# Patient Record
Sex: Male | Born: 1940 | Race: White | Hispanic: No | Marital: Married | Smoking: Former smoker
Health system: Southern US, Community
[De-identification: ages and names within clinical notes are randomized; demographics above are authoritative.]

## PROBLEM LIST (undated history)

## (undated) DIAGNOSIS — Z801 Family history of malignant neoplasm of trachea, bronchus and lung: Secondary | ICD-10-CM

## (undated) DIAGNOSIS — C801 Malignant (primary) neoplasm, unspecified: Secondary | ICD-10-CM

## (undated) DIAGNOSIS — M1990 Unspecified osteoarthritis, unspecified site: Secondary | ICD-10-CM

## (undated) DIAGNOSIS — R319 Hematuria, unspecified: Secondary | ICD-10-CM

## (undated) DIAGNOSIS — R351 Nocturia: Secondary | ICD-10-CM

## (undated) DIAGNOSIS — Z8041 Family history of malignant neoplasm of ovary: Secondary | ICD-10-CM

## (undated) HISTORY — DX: Family history of malignant neoplasm of trachea, bronchus and lung: Z80.1

## (undated) HISTORY — DX: Family history of malignant neoplasm of ovary: Z80.41

---

## 1975-03-08 HISTORY — PX: BACK SURGERY: SHX140

## 1998-03-07 HISTORY — PX: CHOLECYSTECTOMY: SHX55

## 2006-06-28 ENCOUNTER — Ambulatory Visit (HOSPITAL_COMMUNITY): Admission: RE | Admit: 2006-06-28 | Discharge: 2006-06-28 | Payer: Self-pay | Admitting: Orthopaedic Surgery

## 2012-03-07 HISTORY — PX: MENISCUS REPAIR: SHX5179

## 2013-04-26 HISTORY — PX: PROSTATE BIOPSY: SHX241

## 2013-05-31 ENCOUNTER — Other Ambulatory Visit: Payer: Self-pay | Admitting: Urology

## 2013-05-31 NOTE — Progress Notes (Signed)
Surgery on 06/24/13.  Need orders in EPIC.  Thank You.

## 2013-06-14 ENCOUNTER — Other Ambulatory Visit (HOSPITAL_COMMUNITY): Payer: Self-pay | Admitting: Urology

## 2013-06-14 NOTE — Patient Instructions (Addendum)
[place]  06/14/2013   Your procedure is scheduled on: Monday April 20th, 2015  Report to [facility] at 830 AM.  Call this number if you have problems the morning of surgery [phone]   Remember: Maish Vaya  Do not eat food :After Midnight Saturday NIGHT, CLEAR LIQUIDS ALL DAY Sunday 04-25-13, NO CLEAR LIQUIDS AFTER MIDNIGHT Sunday NIGHT      Take these medicines the morning of surgery with A SIP OF WATER:  Eye  drops                               You may not have any metal on your body including hair pins and piercings  Do not wear jewelry, make-up, lotions, powders, or deodorant.   Men may shave face and neck.  Do not bring valuables to the hospital. [place].  Contacts, dentures or bridgework may not be worn into surgery.  Leave suitcase in the car. After surgery it may be brought to your room.  For patients admitted to the hospital, checkout time is 11:00 AM the day of discharge.   Patients discharged the day of surgery will not be allowed to drive home.  Name and phone number of your driver:  Special Instructions: N/A  [facility] - Preparing for Surgery Before surgery, you can play an important role.  Because skin is not sterile, your skin needs to be as free of germs as possible.  You can reduce the number of germs on your skin by washing with CHG (chlorahexidine gluconate) soap before surgery.  CHG is an antiseptic cleaner which kills germs and bonds with the skin to continue killing germs even after washing. Please DO NOT use if you have an allergy to CHG or antibacterial soaps.  If your skin becomes reddened/irritated stop using the CHG and inform your nurse when you arrive at Short Stay. Do not shave (including legs and underarms) for at least 48 hours prior to the first CHG shower.  You may shave your face. Please follow these instructions carefully:  1.  Shower with CHG  Soap the night before surgery and the  morning of Surgery.  2.  If you choose to wash your hair, wash your hair first as usual with your  normal  shampoo.  3.  After you shampoo, rinse your hair and body thoroughly to remove the  shampoo.                           4.  Use CHG as you would any other liquid soap.  You can apply chg directly  to the skin and wash                       Gently with a scrungie or clean washcloth.  5.  Apply the CHG Soap to your body ONLY FROM THE NECK DOWN.   Do not use on open                           Wound or open sores. Avoid contact with eyes, ears mouth and genitals (private parts).                        Genitals (private parts)  with your normal soap.             6.  Wash thoroughly, paying special attention to the area where your surgery  will be performed.  7.  Thoroughly rinse your body with warm water from the neck down.  8.  DO NOT shower/wash with your normal soap after using and rinsing off  the CHG Soap.                9.  Pat yourself dry with a clean towel.            10.  Wear clean pajamas.            11.  Place clean sheets on your bed the night of your first shower and do not  sleep with pets. Day of Surgery : Do not apply any lotions/deodorants the morning of surgery.  Please wear clean clothes to the hospital/surgery center.  FAILURE TO FOLLOW THESE INSTRUCTIONS MAY RESULT IN THE CANCELLATION OF YOUR SURGERY PATIENT SIGNATURE_________________________________  NURSE SIGNATURE__________________________________    CLEAR LIQUID DIET   Foods Allowed                                                                     Foods Excluded  Coffee and tea, regular and decaf                             liquids that you cannot  Plain Jell-O in any flavor                                             see through such as: Fruit ices (not with fruit pulp)                                     milk, soups, orange juice  Iced Popsicles                                     All solid food Carbonated beverages, regular and diet                                    Cranberry, grape and apple juices Sports drinks like Gatorade Lightly seasoned clear broth or consume(fat free) Sugar, honey syrup  Sample Menu Breakfast                                Lunch                                     Supper Cranberry juice                    Beef broth  Chicken broth Jell-O                                     Grape juice                           Apple juice Coffee or tea                        Jell-O                                      Popsicle                                                Coffee or tea                        Coffee or tea Incentive Spirometer  An incentive spirometer is a tool that can help keep your lungs clear and active. This tool measures how well you are filling your lungs with each breath. Taking long deep breaths may help reverse or decrease the chance of developing breathing (pulmonary) problems (especially infection) following:  A long period of time when you are unable to move or be active. BEFORE THE PROCEDURE   If the spirometer includes an indicator to show your best effort, your nurse or respiratory therapist will set it to a desired goal.  If possible, sit up straight or lean slightly forward. Try not to slouch.  Hold the incentive spirometer in an upright position. INSTRUCTIONS FOR USE  1. Sit on the edge of your bed if possible, or sit up as far as you can in bed or on a chair. 2. Hold the incentive spirometer in an upright position. 3. Breathe out normally. 4. Place the mouthpiece in your mouth and seal your lips tightly around it. 5. Breathe in slowly and as deeply as possible, raising the piston or the ball toward the top of the column. 6. Hold your breath for 3-5 seconds or for as long as possible. Allow the piston or ball to fall to the bottom of the column. 7. Remove the mouthpiece from your  mouth and breathe out normally. 8. Rest for a few seconds and repeat Steps 1 through 7 at least 10 times every 1-2 hours when you are awake. Take your time and take a few normal breaths between deep breaths. 9. The spirometer may include an indicator to show your best effort. Use the indicator as a goal to work toward during each repetition. 10. After each set of 10 deep breaths, practice coughing to be sure your lungs are clear. If you have an incision (the cut made at the time of surgery), support your incision when coughing by placing a pillow or rolled up towels firmly against it. Once you are able to get out of bed, walk around indoors and cough well. You may stop using the incentive spirometer when instructed by your caregiver.  RISKS AND COMPLICATIONS  Take your time so you do not get dizzy or light-headed.  If you are in pain, you may need to take or  ask for pain medication before doing incentive spirometry. It is harder to take a deep breath if you are having pain. AFTER USE  Rest and breathe slowly and easily.  It can be helpful to keep track of a log of your progress. Your caregiver can provide you with a simple table to help with this. If you are using the spirometer at home, follow these instructions: [place] IF:   You are having difficultly using the spirometer.  You have trouble using the spirometer as often as instructed.  Your pain medication is not giving enough relief while using the spirometer.  You develop fever of 100.5 F (38.1 C) or higher. SEEK IMMEDIATE MEDICAL CARE IF:   You cough up bloody sputum that had not been present before.  You develop fever of 102 F (38.9 C) or greater.  You develop worsening pain at or near the incision site. MAKE SURE YOU:   Understand these instructions.  Will watch your condition.  Will get help right away if you are not doing well or get worse. Document Released: 07/04/2006 Document Revised: 05/16/2011  Document Reviewed: 09/04/2006 [facility] Patient Information 2014 [place], [place].   WHAT IS A BLOOD TRANSFUSION? Blood Transfusion Information  A transfusion is the replacement of blood or some of its parts. Blood is made up of multiple cells which provide different functions.  Red blood cells carry oxygen and are used for blood loss replacement.  White blood cells fight against infection.  Platelets control bleeding.  Plasma helps clot blood.  Other blood products are available for specialized needs, such as hemophilia or other clotting disorders. BEFORE THE TRANSFUSION  Who gives blood for transfusions?   Healthy volunteers who are fully evaluated to make sure their blood is safe. This is blood bank blood. Transfusion therapy is the safest it has ever been in the practice of medicine. Before blood is taken from a donor, a complete history is taken to make sure that person has no history of diseases nor engages in risky social behavior (examples are intravenous drug use or sexual activity with multiple partners). The donor's travel history is screened to minimize risk of transmitting infections, such as malaria. The donated blood is tested for signs of infectious diseases, such as HIV and hepatitis. The blood is then tested to be sure it is compatible with you in order to minimize the chance of a transfusion reaction. If you or a relative donates blood, this is often done in anticipation of surgery and is not appropriate for emergency situations. It takes many days to process the donated blood. RISKS AND COMPLICATIONS Although transfusion therapy is very safe and saves many lives, the main dangers of transfusion include:   Getting an infectious disease.  Developing a transfusion reaction. This is an allergic reaction to something in the blood you were given. Every precaution is taken to prevent this. The decision to have a blood transfusion has been considered carefully by your caregiver  before blood is given. Blood is not given unless the benefits outweigh the risks. AFTER THE TRANSFUSION  Right after receiving a blood transfusion, you will usually feel much better and more energetic. This is especially true if your red blood cells have gotten low (anemic). The transfusion raises the level of the red blood cells which carry oxygen, and this usually causes an energy increase.  The nurse administering the transfusion will monitor you carefully for complications. HOME CARE INSTRUCTIONS  No special instructions are needed after a transfusion.  You may find your energy is better. Speak with your caregiver about any limitations on activity for underlying diseases you may have. SEEK MEDICAL CARE IF:   Your condition is not improving after your transfusion.  You develop redness or irritation at the intravenous (IV) site. SEEK IMMEDIATE MEDICAL CARE IF:  Any of the following symptoms occur over the next 12 hours:  Shaking chills.  You have a temperature by mouth above 102 F (38.9 C), not controlled by medicine.  Chest, back, or muscle pain.  People around you feel you are not acting correctly or are confused.  Shortness of breath or difficulty breathing.  Dizziness and fainting.  You get a rash or develop hives.  You have a decrease in urine output.  Your urine turns a dark color or changes to pink, red, or brown. Any of the following symptoms occur over the next 10 days:  You have a temperature by mouth above 102 F (38.9 C), not controlled by medicine.  Shortness of breath.  Weakness after normal activity.  The white part of the eye turns yellow (jaundice).  You have a decrease in the amount of urine or are urinating less often.  Your urine turns a dark color or changes to pink, red, or brown. Document Released: 02/19/2000 Document Revised: 05/16/2011 Document Reviewed: 10/08/2007 [facility] Patient Information 2014 [place].

## 2013-06-17 ENCOUNTER — Encounter (HOSPITAL_COMMUNITY)
Admission: RE | Admit: 2013-06-17 | Discharge: 2013-06-17 | Disposition: A | Discharge status: Home / Self Care | Payer: PRIVATE HEALTH INSURANCE | Source: Ambulatory Visit | Attending: Urology | Admitting: Urology

## 2013-06-17 ENCOUNTER — Encounter (INDEPENDENT_AMBULATORY_CARE_PROVIDER_SITE_OTHER): Payer: Self-pay

## 2013-06-17 ENCOUNTER — Encounter (HOSPITAL_COMMUNITY): Payer: Self-pay

## 2013-06-17 ENCOUNTER — Ambulatory Visit (HOSPITAL_COMMUNITY)
Admission: RE | Admit: 2013-06-17 | Discharge: 2013-06-17 | Disposition: A | Discharge status: Home / Self Care | Payer: PRIVATE HEALTH INSURANCE | Source: Ambulatory Visit | Attending: Urology | Admitting: Urology

## 2013-06-17 ENCOUNTER — Encounter (HOSPITAL_COMMUNITY): Payer: Self-pay | Admitting: Pharmacy Technician

## 2013-06-17 DIAGNOSIS — Z01812 Encounter for preprocedural laboratory examination: Secondary | ICD-10-CM | POA: Insufficient documentation

## 2013-06-17 HISTORY — DX: Malignant (primary) neoplasm, unspecified: C80.1

## 2013-06-17 HISTORY — DX: Unspecified osteoarthritis, unspecified site: M19.90

## 2013-06-17 HISTORY — DX: Hematuria, unspecified: R31.9

## 2013-06-17 HISTORY — DX: Nocturia: R35.1

## 2013-06-17 LAB — CBC
HCT: 48 % (ref 39.0–52.0)
Hemoglobin: 16.4 g/dL (ref 13.0–17.0)
MCH: 30.4 pg (ref 26.0–34.0)
MCHC: 34.2 g/dL (ref 30.0–36.0)
MCV: 88.9 fL (ref 78.0–100.0)
Platelets: 217 10*3/uL (ref 150–400)
RBC: 5.4 MIL/uL (ref 4.22–5.81)
RDW: 13.3 % (ref 11.5–15.5)
WBC: 8.9 10*3/uL (ref 4.0–10.5)

## 2013-06-17 LAB — BASIC METABOLIC PANEL
BUN: 17 mg/dL (ref 6–23)
CO2: 26 mEq/L (ref 19–32)
Calcium: 9.8 mg/dL (ref 8.4–10.5)
Chloride: 102 mEq/L (ref 96–112)
Creatinine, Ser: 1.16 mg/dL (ref 0.50–1.35)
GFR calc Af Amer: 72 mL/min — ABNORMAL LOW (ref >90)
GFR calc non Af Amer: 62 mL/min — ABNORMAL LOW (ref >90)
Glucose, Bld: 77 mg/dL (ref 70–99)
Potassium: 4.7 mEq/L (ref 3.7–5.3)
Sodium: 140 mEq/L (ref 137–147)

## 2013-06-21 NOTE — H&P (Signed)
  Chief Complaint Prostate Cancer    History of Present Illness Douglas Baxter is a 73 year old gentleman who was noted to have a rising PSA of 7.5 which prompted a prostate needle biopsy on 04/26/13 by Dr. Lerry Liner which demonstrated Gleason 4+3=7 adenocarcinoma of the prostate. He had undergone a CT scan of the abdomen and pelvis in October 2014 when he had a 3 mm left proximal ureteral stone. No pelvic lymphadenopathy was noted at that time. A bone scan was performed on 05/13/13 which did not demonstrate any evidence of osseous metastatic disease. He has been counseled about his treatment options by Dr. Lerry Liner and Dr. Danny Lawless. Dr. Danny Lawless has offered him EBRT with 8 months of ADT as an option.  He has no major comorbidities.  TNM stage: cT1c N0 M0  PSA: 7.5 Gleason score: 4+3=7 Biopsy (04/26/13 - read by Dr. Willeen Niece, [facility], Acc# [phone]): 20% of right sided biopsy tissue positive Prostate volume:  Urinary function: He has minimal lower urinary tract symptoms with mild nocturia likely related increase fluid intake. IPSS is 5. 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 SHIM score is 19. He does not consider erectile function to be a priority with regard to his quality of life.   Past Medical History Problems  1. History of arthritis (V13.4) 2. History of benign prostatic hypertrophy (V13.89)  Surgical History Problems  1. History of Appendectomy 2. History of Back Surgery 3. History of Cholecystectomy 4. History of Sinus Surgery  Current Meds 1. Aspirin 81 MG Oral Tablet;  Therapy: (Recorded:24Mar2015) to Recorded 2. Cyclobenzaprine HCl - 10 MG Oral Tablet;  Therapy: [id number] to Recorded 3. Meloxicam 15 MG Oral Tablet;  Therapy: [id number] to Recorded 4. Multi-Vitamin TABS;  Therapy: (Recorded:24Mar2015) to Recorded  Allergies Medication  1. Sulfa Drugs  Family History Problems  1. Family history of blood clots (V18.3) : Mother 2. Family  history of cardiac disorder (V17.49) : Father 3. Family history of lung cancer (V16.1) : Brother 4. Family history of ovarian cancer (V16.41) : Sister  Social History Problems    Alcohol use (V49.89)   drinks hard liquor   Former smoker [place])   smoked about a pack a day for 20 years; quit smoking in 1995   Married  Review of Systems Genitourinary, constitutional, skin, eye, otolaryngeal, hematologic/lymphatic, cardiovascular, pulmonary, endocrine, musculoskeletal, gastrointestinal, neurological and psychiatric system(s) were reviewed and pertinent findings if present are noted.    Vitals  Height: 6 ft 1 in Weight: 180 lb  BMI Calculated: 23.75 BSA Calculated: 2.06   Physical Exam Constitutional: Well nourished and well developed . No acute distress.  ENT:. The ears and nose are normal in appearance.  Neck: The appearance of the neck is normal and no neck mass is present.  Pulmonary: No respiratory distress, normal respiratory rhythm and effort and clear bilateral breath sounds.  Cardiovascular: Heart rate and rhythm are normal . No peripheral edema.  Abdomen: The abdomen is soft and nontender. No masses are palpated. No CVA tenderness. No hernias are palpable. No hepatosplenomegaly noted.    Assessment Assessed  1. Prostate cancer (185)   Discussion/Summary 1. Prostate cancer:  He would like to go forward with surgical therapy and will undergo a bilateral nerve sparing robotic-assisted laparoscopic radical prostatectomy and bilateral pelvic lymphadenectomy.

## 2013-06-24 ENCOUNTER — Encounter (HOSPITAL_COMMUNITY)
Admission: RE | Disposition: A | Discharge status: Home / Self Care | Payer: Self-pay | Source: Ambulatory Visit | Attending: Urology

## 2013-06-24 ENCOUNTER — Encounter (HOSPITAL_COMMUNITY): Payer: Self-pay | Admitting: *Deleted

## 2013-06-24 ENCOUNTER — Inpatient Hospital Stay (HOSPITAL_COMMUNITY): Payer: PRIVATE HEALTH INSURANCE | Admitting: Anesthesiology

## 2013-06-24 ENCOUNTER — Inpatient Hospital Stay (HOSPITAL_COMMUNITY)
Admission: RE | Admit: 2013-06-24 | Discharge: 2013-06-25 | DRG: 708 | Disposition: A | Discharge status: Home / Self Care | Payer: PRIVATE HEALTH INSURANCE | Source: Ambulatory Visit | Attending: Urology | Admitting: Urology

## 2013-06-24 ENCOUNTER — Encounter (HOSPITAL_COMMUNITY): Payer: PRIVATE HEALTH INSURANCE | Admitting: Anesthesiology

## 2013-06-24 DIAGNOSIS — Z7982 Long term (current) use of aspirin: Secondary | ICD-10-CM

## 2013-06-24 DIAGNOSIS — Z8249 Family history of ischemic heart disease and other diseases of the circulatory system: Secondary | ICD-10-CM

## 2013-06-24 DIAGNOSIS — N3289 Other specified disorders of bladder: Secondary | ICD-10-CM | POA: Diagnosis not present

## 2013-06-24 DIAGNOSIS — C61 Malignant neoplasm of prostate: Principal | ICD-10-CM | POA: Diagnosis present

## 2013-06-24 DIAGNOSIS — Z801 Family history of malignant neoplasm of trachea, bronchus and lung: Secondary | ICD-10-CM

## 2013-06-24 DIAGNOSIS — Z79899 Other long term (current) drug therapy: Secondary | ICD-10-CM

## 2013-06-24 DIAGNOSIS — Z8041 Family history of malignant neoplasm of ovary: Secondary | ICD-10-CM

## 2013-06-24 DIAGNOSIS — Z87891 Personal history of nicotine dependence: Secondary | ICD-10-CM

## 2013-06-24 DIAGNOSIS — Z882 Allergy status to sulfonamides status: Secondary | ICD-10-CM

## 2013-06-24 DIAGNOSIS — Z832 Family history of diseases of the blood and blood-forming organs and certain disorders involving the immune mechanism: Secondary | ICD-10-CM

## 2013-06-24 HISTORY — PX: ROBOT ASSISTED LAPAROSCOPIC RADICAL PROSTATECTOMY: SHX5141

## 2013-06-24 HISTORY — PX: LYMPHADENECTOMY: SHX5960

## 2013-06-24 LAB — TYPE AND SCREEN
ABO/RH(D): B POS
ANTIBODY SCREEN: NEGATIVE

## 2013-06-24 LAB — HEMOGLOBIN AND HEMATOCRIT, BLOOD
HCT: 45.9 % (ref 39.0–52.0)
HEMOGLOBIN: 15.7 g/dL (ref 13.0–17.0)

## 2013-06-24 LAB — ABO/RH: ABO/RH(D): B POS

## 2013-06-24 SURGERY — ROBOTIC ASSISTED LAPAROSCOPIC RADICAL PROSTATECTOMY LEVEL 2
Anesthesia: General

## 2013-06-24 MED ORDER — HYDROMORPHONE HCL PF 1 MG/ML IJ SOLN
INTRAMUSCULAR | Status: AC
  Filled 2013-06-24: qty 1

## 2013-06-24 MED ORDER — CEFAZOLIN SODIUM-DEXTROSE 2-3 GM-% IV SOLR
INTRAVENOUS | Status: AC
  Filled 2013-06-24: qty 50

## 2013-06-24 MED ORDER — LACTATED RINGERS IV SOLN
INTRAVENOUS | Status: DC | PRN
  Administered 2013-06-24: 11:00:00

## 2013-06-24 MED ORDER — ROCURONIUM BROMIDE 100 MG/10ML IV SOLN
INTRAVENOUS | Status: AC
  Filled 2013-06-24: qty 1

## 2013-06-24 MED ORDER — SODIUM CHLORIDE 0.9 % IR SOLN
Status: DC | PRN
  Administered 2013-06-24: 14:00:00
  Administered 2013-06-24: 1000 mL via INTRAVESICAL

## 2013-06-24 MED ORDER — PROPOFOL 10 MG/ML IV BOLUS
INTRAVENOUS | Status: DC | PRN
  Administered 2013-06-24: 150 mg via INTRAVENOUS

## 2013-06-24 MED ORDER — PROPOFOL 10 MG/ML IV BOLUS
INTRAVENOUS | Status: AC
Start: 2013-06-24 — End: 2013-06-24
  Filled 2013-06-24: qty 20

## 2013-06-24 MED ORDER — SUCCINYLCHOLINE CHLORIDE 20 MG/ML IJ SOLN
INTRAMUSCULAR | Status: DC | PRN
  Administered 2013-06-24: 100 mg via INTRAVENOUS

## 2013-06-24 MED ORDER — HEPARIN SODIUM (PORCINE) 1000 UNIT/ML IJ SOLN
INTRAMUSCULAR | Status: AC
  Filled 2013-06-24: qty 1

## 2013-06-24 MED ORDER — HYDROCODONE-ACETAMINOPHEN 5-325 MG PO TABS: 1.0000 | ORAL_TABLET | Freq: Four times a day (QID) | ORAL | Status: DC | PRN

## 2013-06-24 MED ORDER — SODIUM CHLORIDE 0.9 % IJ SOLN
INTRAMUSCULAR | Status: AC
  Filled 2013-06-24: qty 10

## 2013-06-24 MED ORDER — BELLADONNA ALKALOIDS-OPIUM 16.2-60 MG RE SUPP
RECTAL | Status: AC
  Filled 2013-06-24: qty 1

## 2013-06-24 MED ORDER — HYDROMORPHONE HCL PF 1 MG/ML IJ SOLN: 0.5000 mg | INTRAMUSCULAR | Status: DC | PRN

## 2013-06-24 MED ORDER — HYDROMORPHONE HCL PF 1 MG/ML IJ SOLN
0.2500 mg | INTRAMUSCULAR | Status: DC | PRN
  Administered 2013-06-24 (×3): 0.5 mg via INTRAVENOUS
  Administered 2013-06-24: 0.25 mg via INTRAVENOUS
  Administered 2013-06-24: 0.5 mg via INTRAVENOUS

## 2013-06-24 MED ORDER — HYDRALAZINE HCL 20 MG/ML IJ SOLN
INTRAMUSCULAR | Status: AC
  Filled 2013-06-24: qty 1

## 2013-06-24 MED ORDER — KETOROLAC TROMETHAMINE 15 MG/ML IJ SOLN
15.0000 mg | Freq: Four times a day (QID) | INTRAMUSCULAR | Status: DC
  Administered 2013-06-24 – 2013-06-25 (×4): 15 mg via INTRAVENOUS
  Filled 2013-06-24 (×5): qty 1

## 2013-06-24 MED ORDER — DEXAMETHASONE SODIUM PHOSPHATE 10 MG/ML IJ SOLN
INTRAMUSCULAR | Status: DC | PRN
  Administered 2013-06-24: 10 mg via INTRAVENOUS

## 2013-06-24 MED ORDER — MORPHINE SULFATE 2 MG/ML IJ SOLN
2.0000 mg | INTRAMUSCULAR | Status: DC | PRN
  Administered 2013-06-24: 2 mg via INTRAVENOUS
  Filled 2013-06-24: qty 1

## 2013-06-24 MED ORDER — BELLADONNA ALKALOIDS-OPIUM 16.2-60 MG RE SUPP: 1.0000 | Freq: Four times a day (QID) | RECTAL | Status: DC | PRN

## 2013-06-24 MED ORDER — GLYCOPYRROLATE 0.2 MG/ML IJ SOLN
INTRAMUSCULAR | Status: DC | PRN
Start: 2013-06-24 — End: 2013-06-24
  Administered 2013-06-24: 0.6 mg via INTRAVENOUS

## 2013-06-24 MED ORDER — BELLADONNA ALKALOIDS-OPIUM 16.2-60 MG RE SUPP
1.0000 | Freq: Every day | RECTAL | Status: DC
  Administered 2013-06-24: 1 via RECTAL

## 2013-06-24 MED ORDER — ROCURONIUM BROMIDE 100 MG/10ML IV SOLN
INTRAVENOUS | Status: DC | PRN
  Administered 2013-06-24: 10 mg via INTRAVENOUS
  Administered 2013-06-24: 20 mg via INTRAVENOUS
  Administered 2013-06-24: 30 mg via INTRAVENOUS

## 2013-06-24 MED ORDER — NEOSTIGMINE METHYLSULFATE 1 MG/ML IJ SOLN
INTRAMUSCULAR | Status: DC | PRN
  Administered 2013-06-24: 4 mg via INTRAVENOUS

## 2013-06-24 MED ORDER — KETOROLAC TROMETHAMINE 15 MG/ML IJ SOLN
INTRAMUSCULAR | Status: AC
  Administered 2013-06-24: 15 mg
  Filled 2013-06-24: qty 1

## 2013-06-24 MED ORDER — NEOSTIGMINE METHYLSULFATE 1 MG/ML IJ SOLN
INTRAMUSCULAR | Status: AC
  Filled 2013-06-24: qty 10

## 2013-06-24 MED ORDER — BUPIVACAINE-EPINEPHRINE 0.25% -1:200000 IJ SOLN
INTRAMUSCULAR | Status: DC | PRN
  Administered 2013-06-24: 26 mL

## 2013-06-24 MED ORDER — ONDANSETRON HCL 4 MG/2ML IJ SOLN
INTRAMUSCULAR | Status: AC
  Filled 2013-06-24: qty 2

## 2013-06-24 MED ORDER — FENTANYL CITRATE 0.05 MG/ML IJ SOLN
INTRAMUSCULAR | Status: AC
  Filled 2013-06-24: qty 5

## 2013-06-24 MED ORDER — CEFAZOLIN SODIUM-DEXTROSE 2-3 GM-% IV SOLR
2.0000 g | INTRAVENOUS | Status: AC
  Administered 2013-06-24: 2 g via INTRAVENOUS

## 2013-06-24 MED ORDER — BUPIVACAINE-EPINEPHRINE (PF) 0.25% -1:200000 IJ SOLN
INTRAMUSCULAR | Status: AC
  Filled 2013-06-24: qty 30

## 2013-06-24 MED ORDER — HYDRALAZINE HCL 20 MG/ML IJ SOLN
INTRAMUSCULAR | Status: DC | PRN
  Administered 2013-06-24 (×4): 5 mg via INTRAVENOUS

## 2013-06-24 MED ORDER — KCL IN DEXTROSE-NACL 20-5-0.45 MEQ/L-%-% IV SOLN
INTRAVENOUS | Status: DC
  Administered 2013-06-24 – 2013-06-25 (×2): via INTRAVENOUS
  Filled 2013-06-24 (×4): qty 1000

## 2013-06-24 MED ORDER — ACETAMINOPHEN 325 MG PO TABS: 650.0000 mg | ORAL_TABLET | ORAL | Status: DC | PRN

## 2013-06-24 MED ORDER — SODIUM CHLORIDE 0.9 % IV BOLUS (SEPSIS)
1000.0000 mL | Freq: Once | INTRAVENOUS | Status: AC
  Administered 2013-06-24: 1000 mL via INTRAVENOUS

## 2013-06-24 MED ORDER — DOCUSATE SODIUM 100 MG PO CAPS
100.0000 mg | ORAL_CAPSULE | Freq: Two times a day (BID) | ORAL | Status: DC
  Administered 2013-06-24 – 2013-06-25 (×2): 100 mg via ORAL
  Filled 2013-06-24 (×3): qty 1

## 2013-06-24 MED ORDER — POLYVINYL ALCOHOL 1.4 % OP SOLN
1.0000 [drp] | Freq: Three times a day (TID) | OPHTHALMIC | Status: DC | PRN
  Filled 2013-06-24: qty 15

## 2013-06-24 MED ORDER — GLYCOPYRROLATE 0.2 MG/ML IJ SOLN
INTRAMUSCULAR | Status: AC
  Filled 2013-06-24: qty 3

## 2013-06-24 MED ORDER — PROMETHAZINE HCL 25 MG/ML IJ SOLN: 6.2500 mg | INTRAMUSCULAR | Status: DC | PRN

## 2013-06-24 MED ORDER — BACITRACIN-NEOMYCIN-POLYMYXIN 400-5-5000 EX OINT
TOPICAL_OINTMENT | CUTANEOUS | Status: AC
  Filled 2013-06-24: qty 1

## 2013-06-24 MED ORDER — FENTANYL CITRATE 0.05 MG/ML IJ SOLN
INTRAMUSCULAR | Status: DC | PRN
  Administered 2013-06-24: 100 ug via INTRAVENOUS
  Administered 2013-06-24: 25 ug via INTRAVENOUS
  Administered 2013-06-24 (×5): 50 ug via INTRAVENOUS
  Administered 2013-06-24: 25 ug via INTRAVENOUS

## 2013-06-24 MED ORDER — CEFAZOLIN SODIUM 1-5 GM-% IV SOLN
1.0000 g | Freq: Three times a day (TID) | INTRAVENOUS | Status: AC
  Administered 2013-06-24 – 2013-06-25 (×2): 1 g via INTRAVENOUS
  Filled 2013-06-24 (×2): qty 50

## 2013-06-24 MED ORDER — DIPHENHYDRAMINE HCL 50 MG/ML IJ SOLN: 12.5000 mg | Freq: Four times a day (QID) | INTRAMUSCULAR | Status: DC | PRN

## 2013-06-24 MED ORDER — ONDANSETRON HCL 4 MG/2ML IJ SOLN
INTRAMUSCULAR | Status: DC | PRN
  Administered 2013-06-24: 4 mg via INTRAVENOUS

## 2013-06-24 MED ORDER — LACTATED RINGERS IV SOLN
INTRAVENOUS | Status: DC
  Administered 2013-06-24: 1000 mL via INTRAVENOUS
  Administered 2013-06-24: 12:00:00 via INTRAVENOUS

## 2013-06-24 MED ORDER — DEXAMETHASONE SODIUM PHOSPHATE 10 MG/ML IJ SOLN
INTRAMUSCULAR | Status: AC
  Filled 2013-06-24: qty 1

## 2013-06-24 MED ORDER — DIPHENHYDRAMINE HCL 12.5 MG/5ML PO ELIX: 12.5000 mg | ORAL_SOLUTION | Freq: Four times a day (QID) | ORAL | Status: DC | PRN

## 2013-06-24 MED ORDER — CIPROFLOXACIN HCL 500 MG PO TABS: 500.0000 mg | ORAL_TABLET | Freq: Two times a day (BID) | ORAL | Status: DC

## 2013-06-24 MED ORDER — HYPROMELLOSE (GONIOSCOPIC) 2.5 % OP SOLN: 1.0000 [drp] | Freq: Three times a day (TID) | OPHTHALMIC | Status: DC | PRN

## 2013-06-24 SURGICAL SUPPLY — 45 items
ADH SKN CLS APL DERMABOND .7 (GAUZE/BANDAGES/DRESSINGS) ×2
CABLE HIGH FREQUENCY MONO STRZ (ELECTRODE) ×4 IMPLANT
CATH FOLEY 2WAY SLVR  5CC 18FR (CATHETERS) ×2
CATH FOLEY 2WAY SLVR 18FR 30CC (CATHETERS) ×4 IMPLANT
CATH FOLEY 2WAY SLVR 5CC 18FR (CATHETERS) IMPLANT
CATH ROBINSON RED A/P 16FR (CATHETERS) ×4 IMPLANT
CATH ROBINSON RED A/P 8FR (CATHETERS) ×4 IMPLANT
CATH TIEMANN FOLEY 18FR 5CC (CATHETERS) ×4 IMPLANT
CHLORAPREP W/TINT 26ML (MISCELLANEOUS) ×4 IMPLANT
CLIP LIGATING HEM O LOK PURPLE (MISCELLANEOUS) ×8 IMPLANT
CLOTH BEACON ORANGE TIMEOUT ST (SAFETY) ×4 IMPLANT
COVER SURGICAL LIGHT HANDLE (MISCELLANEOUS) ×4 IMPLANT
COVER TIP SHEARS 8 DVNC (MISCELLANEOUS) ×2 IMPLANT
COVER TIP SHEARS 8MM DA VINCI (MISCELLANEOUS) ×2
CUTTER ECHEON FLEX ENDO 45 340 (ENDOMECHANICALS) ×4 IMPLANT
DECANTER SPIKE VIAL GLASS SM (MISCELLANEOUS) ×2 IMPLANT
DERMABOND ADVANCED (GAUZE/BANDAGES/DRESSINGS) ×2
DERMABOND ADVANCED .7 DNX12 (GAUZE/BANDAGES/DRESSINGS) IMPLANT
DRAPE SURG IRRIG POUCH 19X23 (DRAPES) ×4 IMPLANT
DRSG TEGADERM 4X4.75 (GAUZE/BANDAGES/DRESSINGS) ×4 IMPLANT
DRSG TEGADERM 6X8 (GAUZE/BANDAGES/DRESSINGS) ×8 IMPLANT
ELECT REM PT RETURN 9FT ADLT (ELECTROSURGICAL) ×4
ELECTRODE REM PT RTRN 9FT ADLT (ELECTROSURGICAL) ×2 IMPLANT
GLOVE BIO SURGEON STRL SZ 6.5 (GLOVE) ×3 IMPLANT
GLOVE BIO SURGEONS STRL SZ 6.5 (GLOVE) ×1
GLOVE BIOGEL M STRL SZ7.5 (GLOVE) ×26 IMPLANT
GOWN STRL REUS W/TWL LRG LVL3 (GOWN DISPOSABLE) ×20 IMPLANT
HOLDER FOLEY CATH W/STRAP (MISCELLANEOUS) ×4 IMPLANT
IV LACTATED RINGERS 1000ML (IV SOLUTION) ×4 IMPLANT
KIT ACCESSORY DA VINCI DISP (KITS) ×2
KIT ACCESSORY DVNC DISP (KITS) ×2 IMPLANT
MANIFOLD NEPTUNE II (INSTRUMENTS) ×4 IMPLANT
NDL SAFETY ECLIPSE 18X1.5 (NEEDLE) ×2 IMPLANT
NEEDLE HYPO 18GX1.5 SHARP (NEEDLE) ×4
PACK ROBOT UROLOGY CUSTOM (CUSTOM PROCEDURE TRAY) ×4 IMPLANT
RELOAD GREEN ECHELON 45 (STAPLE) ×4 IMPLANT
SET TUBE IRRIG SUCTION NO TIP (IRRIGATION / IRRIGATOR) ×4 IMPLANT
SOLUTION ELECTROLUBE (MISCELLANEOUS) ×4 IMPLANT
SUT ETHILON 3 0 PS 1 (SUTURE) ×4 IMPLANT
SUT MNCRL AB 4-0 PS2 18 (SUTURE) ×8 IMPLANT
SUT VICRYL 0 UR6 27IN ABS (SUTURE) ×8 IMPLANT
SYR 27GX1/2 1ML LL SAFETY (SYRINGE) ×4 IMPLANT
TOWEL OR 17X26 10 PK STRL BLUE (TOWEL DISPOSABLE) ×4 IMPLANT
TOWEL OR NON WOVEN STRL DISP B (DISPOSABLE) ×4 IMPLANT
WATER STERILE IRR 1500ML POUR (IV SOLUTION) ×8 IMPLANT

## 2013-06-24 NOTE — Transfer of Care (Signed)
Immediate Anesthesia Transfer of Care Note  Patient: Douglas Baxter  Procedure(s) Performed: Procedure(s): ROBOTIC ASSISTED LAPAROSCOPIC RADICAL PROSTATECTOMY LEVEL 2 (N/A) LYMPHADENECTOMY (Bilateral)  Patient Location: PACU  Anesthesia Type:General  Level of Consciousness: awake, alert , oriented and patient cooperative  Airway & Oxygen Therapy: Patient Spontanous Breathing and Patient connected to face mask oxygen  Post-op Assessment: Report given to PACU RN and Post -op Vital signs reviewed and stable  Post vital signs: Reviewed and stable  Complications: No apparent anesthesia complications

## 2013-06-24 NOTE — Anesthesia Postprocedure Evaluation (Signed)
  Anesthesia Post-op Note  Patient: Douglas Baxter  Procedure(s) Performed: Procedure(s) (LRB): ROBOTIC ASSISTED LAPAROSCOPIC RADICAL PROSTATECTOMY LEVEL 2 (N/A) LYMPHADENECTOMY (Bilateral)  Patient Location: PACU  Anesthesia Type: General  Level of Consciousness: awake and alert   Airway and Oxygen Therapy: Patient Spontanous Breathing  Post-op Pain: mild  Post-op Assessment: Post-op Vital signs reviewed, Patient's Cardiovascular Status Stable, Respiratory Function Stable, Patent Airway and No signs of Nausea or vomiting  Last Vitals:  Filed Vitals:   06/24/13 1245  BP: 118/47  Pulse: 70  Temp:   Resp: 12    Post-op Vital Signs: stable   Complications: No apparent anesthesia complications

## 2013-06-24 NOTE — Addendum Note (Signed)
Addendum created 06/24/13 1342 by Salley Scarlet, MD   Modules edited: Orders

## 2013-06-24 NOTE — Anesthesia Preprocedure Evaluation (Signed)
Anesthesia Evaluation  Patient identified by MRN, date of birth, ID band Patient awake    Reviewed: Allergy & Precautions, H&P , NPO status , Patient's Chart, lab work & pertinent test results  Airway Mallampati: II TM Distance: >3 FB Neck ROM: Full    Dental no notable dental hx.    Pulmonary former smoker,  breath sounds clear to auscultation  Pulmonary exam normal       Cardiovascular negative cardio ROS  Rhythm:Regular Rate:Normal     Neuro/Psych negative neurological ROS  negative psych ROS   GI/Hepatic negative GI ROS, Neg liver ROS,   Endo/Other  negative endocrine ROS  Renal/GU negative Renal ROS  negative genitourinary   Musculoskeletal negative musculoskeletal ROS (+)   Abdominal   Peds negative pediatric ROS (+)  Hematology negative hematology ROS (+)   Anesthesia Other Findings   Reproductive/Obstetrics negative OB ROS                           Anesthesia Physical Anesthesia Plan  ASA: II  Anesthesia Plan: General   Post-op Pain Management:    Induction: Intravenous  Airway Management Planned: Oral ETT  Additional Equipment:   Intra-op Plan:   Post-operative Plan: Extubation in OR  Informed Consent: I have reviewed the patients History and Physical, chart, labs and discussed the procedure including the risks, benefits and alternatives for the proposed anesthesia with the patient or authorized representative who has indicated his/her understanding and acceptance.   Dental advisory given  Plan Discussed with: CRNA  Anesthesia Plan Comments:         Anesthesia Quick Evaluation

## 2013-06-24 NOTE — Discharge Instructions (Signed)
1. Activity:  You are encouraged to ambulate frequently (about every hour during waking hours) to help prevent blood clots from forming in your legs or lungs.  However, you should not engage in any heavy lifting (> 10-15 lbs), strenuous activity, or straining. 2. Diet: You should continue a clear liquid diet until passing gas from below.  Once this occurs, you may advance your diet to a soft diet that would be easy to digest (i.e soups, scrambled eggs, mashed potatoes, etc.) for 24 hours just as you would if getting over a bad stomach flu.  If tolerating this diet well for 24 hours, you may then begin eating regular food.  It will be normal to have some amount of bloating, nausea, and abdominal discomfort intermittently. 3. Prescriptions:  You will be provided a prescription for pain medication to take as needed.  If your pain is not severe enough to require the prescription pain medication, you may take Tylenol instead.  You should also take an over the counter stool softener (Colace 100 mg twice daily) to avoid straining with bowel movements as the pain medication may constipate you. Finally, you will also be provided a prescription for an antibiotic to begin the day prior to your return visit in the office for catheter removal. 4. Catheter care: You will be taught how to take care of the catheter by the nursing staff prior to discharge from the hospital.  You may use both a leg bag and the larger bedside bag but it is recommended to at least use the bigger bedside bag at nighttime as the leg bag is small and will fill up overnight and also does not drain as well when lying flat. You may periodically feel a strong urge to void with the catheter in place.  This is a bladder spasm and most often can occur when having a bowel movement or when you are moving around. It is typically self-limited and usually will stop after a few minutes.  You may use some Vaseline or Neosporin around the tip of the catheter to  reduce friction at the tip of the penis. 5. Incisions: You may remove your dressing bandages the 2nd day after surgery.  You most likely will have a few small staples in each of the incisions and once the bandages are removed, the incisions may stay open to air.  You may start showering (not soaking or bathing in water) 48 hours after surgery and the incisions simply need to be patted dry after the shower.  No additional care is needed. 6. What to call us about: You should call the office [phone]) if you develop fever > 101, persistent vomiting, or the catheter stops draining. Also, feel free to call with any other questions you may have and remember the handout that was provided to you as a reference preoperatively which answers many of the common questions that arise after surgery.  You may resume aspirin, advil, aleve, vitamins, and supplements 7 days after surgery.

## 2013-06-24 NOTE — Progress Notes (Signed)
Patient ID: SHAWNA KIENER, male   DOB: 1940-11-12, 73 y.o.   MRN: [id number]  Post-op note  Subjective: The patient is doing well.  Complains of bladder spasms  Objective: Vital signs in last 24 hours: Temp:  [97.5 F (36.4 C)-98.1 F (36.7 C)] 97.5 F (36.4 C) (04/20 1426) Pulse Rate:  [61-78] 65 (04/20 1426) Resp:  [10-20] 15 (04/20 1426) BP: (98-135)/(47-92) 123/55 mmHg (04/20 1426) SpO2:  [93 %-100 %] 93 % (04/20 1426) Weight:  [85.276 kg (188 lb)] 85.276 kg (188 lb) (04/20 1111)  Intake/Output from previous day:   Intake/Output this shift: Total I/O In: 3400 [I.V.:1400; IV Piggyback:2000] Out: 140 [Urine:50; Drains:40; Blood:50]  Physical Exam:  General: Alert and oriented. Abdomen: Soft, Nondistended. Incisions: Clean and dry.  Lab Results:  Recent Labs  06/24/13 1301  HGB 15.7  HCT 45.9    Assessment/Plan: POD#0   1) B & O suppository, NS bolus 500 cc 2) Ambulate, IS   Pryor Curia. MD   LOS: 0 days   [name] 06/24/2013, 2:30 PM

## 2013-06-24 NOTE — Op Note (Signed)
Preoperative diagnosis: Clinically localized adenocarcinoma of the prostate (clinical stage T1c N0 M0)  Postoperative diagnosis: Clinically localized adenocarcinoma of the prostate (clinical stage T1c N0 M0)  Procedure:  1. Robotic assisted laparoscopic radical prostatectomy (bilateral nerve sparing) 2. Bilateral robotic assisted laparoscopic pelvic lymphadenectomy  Surgeon: Pryor Curia. M.D.  Assistant: Leta Baptist, PA-C  Anesthesia: General  Complications: None  EBL: 50 mL  IVF:  1000 mL crystalloid  Specimens: 1. Prostate and seminal vesicles 2. Right pelvic lymph nodes 3. Left pelvic lymph nodes  Disposition of specimens: Pathology  Drains: 1. 20 Fr coude catheter 2. # 19 Blake pelvic drain  Indication: Douglas Baxter is a 73 y.o. year old patient with clinically localized prostate cancer.  After a thorough review of the management options for treatment of prostate cancer, he elected to proceed with surgical therapy and the above procedure(s).  We have discussed the potential benefits and risks of the procedure, side effects of the proposed treatment, the likelihood of the patient achieving the goals of the procedure, and any potential problems that might occur during the procedure or recuperation. Informed consent has been obtained.  Description of procedure:  The patient was taken to the operating room and a general anesthetic was administered. He was given preoperative antibiotics, placed in the dorsal lithotomy position, and prepped and draped in the usual sterile fashion. Next a preoperative timeout was performed. A urethral catheter was placed into the bladder and a site was selected near the umbilicus for placement of the camera port. This was placed using a standard open Hassan technique which allowed entry into the peritoneal cavity under direct vision and without difficulty. A 12 mm port was placed and a pneumoperitoneum established. The camera was then  used to inspect the abdomen and there was no evidence of any intra-abdominal injuries or other abnormalities. The remaining abdominal ports were then placed. 8 mm robotic ports were placed in the right lower quadrant, left lower quadrant, and far left lateral abdominal wall. A 5 mm port was placed in the right upper quadrant and a 12 mm port was placed in the right lateral abdominal wall for laparoscopic assistance. All ports were placed under direct vision without difficulty. The surgical cart was then docked.   Utilizing the cautery scissors, the bladder was reflected posteriorly allowing entry into the space of Retzius and identification of the endopelvic fascia and prostate. The periprostatic fat was then removed from the prostate allowing full exposure of the endopelvic fascia. The endopelvic fascia was then incised from the apex back to the base of the prostate bilaterally and the underlying levator muscle fibers were swept laterally off the prostate thereby isolating the dorsal venous complex. The dorsal vein was then stapled and divided with a 45 mm Flex Echelon stapler. Attention then turned to the bladder neck which was divided anteriorly thereby allowing entry into the bladder and exposure of the urethral catheter. The catheter balloon was deflated and the catheter was brought into the operative field and used to retract the prostate anteriorly. The posterior bladder neck was then examined and was divided allowing further dissection between the bladder and prostate posteriorly until the vasa deferentia and seminal vessels were identified. The vasa deferentia were isolated, divided, and lifted anteriorly. The seminal vesicles were dissected down to their tips with care to control the seminal vascular arterial blood supply. These structures were then lifted anteriorly and the space between Denonvillier's fascia and the anterior rectum was developed with a combination  of sharp and blunt dissection. This  isolated the vascular pedicles of the prostate.  The lateral prostatic fascia was then sharply incised allowing release of the neurovascular bundles bilaterally. The vascular pedicles of the prostate were then ligated with Weck clips between the prostate and neurovascular bundles and divided with sharp cold scissor dissection resulting in neurovascular bundle preservation. The neurovascular bundles were then separated off the apex of the prostate and urethra bilaterally.  The urethra was then sharply transected allowing the prostate specimen to be disarticulated. The pelvis was copiously irrigated and hemostasis was ensured. There was no evidence for rectal injury.  Attention then turned to the right pelvic sidewall. The fibrofatty tissue between the external iliac vein, confluence of the iliac vessels, hypogastric artery, and Cooper's ligament was dissected free from the pelvic sidewall with care to preserve the obturator nerve. Weck clips were used for lymphostasis and hemostasis. An identical procedure was performed on the contralateral side and the lymphatic packets were removed for permanent pathologic analysis.  Attention then turned to the urethral anastomosis. A 2-0 Vicryl slip knot was placed between Denonvillier's fascia, the posterior bladder neck, and the posterior urethra to reapproximate these structures. A double-armed 3-0 Monocryl suture was then used to perform a 360 running tension-free anastomosis between the bladder neck and urethra. A new urethral catheter was then placed into the bladder and irrigated. There were no blood clots within the bladder and the anastomosis appeared to be watertight. A #19 Blake drain was then brought through the left lateral 8 mm port site and positioned appropriately within the pelvis. It was secured to the skin with a nylon suture. The surgical cart was then undocked. The right lateral 12 mm port site was closed at the fascial level with a 0 Vicryl suture  placed laparoscopically. All remaining ports were then removed under direct vision. The prostate specimen was removed intact within the Endopouch retrieval bag via the periumbilical camera port site. This fascial opening was closed with two running 0 Vicryl sutures. 0.25% Marcaine was then injected into all port sites and all incisions were reapproximated at the skin level with 4-0 Monocryl subcuticular sutures and Dermabond. The patient appeared to tolerate the procedure well and without complications. The patient was able to be extubated and transferred to the recovery unit in satisfactory condition.   Pryor Curia MD

## 2013-06-25 ENCOUNTER — Encounter (HOSPITAL_COMMUNITY): Payer: Self-pay | Admitting: Urology

## 2013-06-25 LAB — HEMOGLOBIN AND HEMATOCRIT, BLOOD
HCT: 42.2 % (ref 39.0–52.0)
Hemoglobin: 14.4 g/dL (ref 13.0–17.0)

## 2013-06-25 MED ORDER — BISACODYL 10 MG RE SUPP
10.0000 mg | Freq: Once | RECTAL | Status: AC
  Administered 2013-06-25: 10 mg via RECTAL
  Filled 2013-06-25: qty 1

## 2013-06-25 MED ORDER — HYDROCODONE-ACETAMINOPHEN 5-325 MG PO TABS: 1.0000 | ORAL_TABLET | Freq: Four times a day (QID) | ORAL | Status: DC | PRN

## 2013-06-25 NOTE — Progress Notes (Signed)
Patient ID: Douglas Baxter, male   DOB: 1940/12/25, 73 y.o.   MRN: [id number]  1 Day Post-Op Subjective: The patient is doing well.  No nausea or vomiting. Pain is adequately controlled.  Objective: Vital signs in last 24 hours: Temp:  [97.5 F (36.4 C)-98.5 F (36.9 C)] 98.2 F (36.8 C) (04/21 0427) Pulse Rate:  [47-78] 49 (04/21 0427) Resp:  [10-20] 18 (04/21 0427) BP: (98-135)/(47-92) 129/56 mmHg (04/21 0427) SpO2:  [93 %-100 %] 97 % (04/21 0427) Weight:  [85.276 kg (188 lb)] 85.276 kg (188 lb) (04/20 1439)  Intake/Output from previous day: 04/20 0701 - 04/21 0700 In: 6462.5 [P.O.:720; I.V.:3642.5; IV Piggyback:2100] Out: 1170 [Urine:960; Drains:160; Blood:50] Intake/Output this shift:    Physical Exam:  General: Alert and oriented. CV: RRR Lungs: Clear bilaterally. GI: Soft, Nondistended. Incisions: Clean, dry, and intact Urine: Clear Extremities: Nontender, no erythema, no edema.  Lab Results:  Recent Labs  06/24/13 1301 06/25/13 0340  HGB 15.7 14.4  HCT 45.9 42.2      Assessment/Plan: POD# 1 s/p robotic prostatectomy.  1) SL IVF 2) Ambulate, Incentive spirometry 3) Transition to oral pain medication 4) Dulcolax suppository 5) D/C pelvic drain 6) Plan for likely discharge later today   Pryor Curia. MD   LOS: 1 day   [name] 06/25/2013, 7:05 AM

## 2013-06-25 NOTE — Discharge Summary (Signed)
  Date of admission: 06/24/2013  Date of discharge: 06/25/2013  Admission diagnosis: Prostate Cancer  Discharge diagnosis: Prostate Cancer  History and Physical: For full details, please see admission history and physical. Briefly, Douglas Baxter is a 73 y.o. gentleman with localized prostate cancer.  After discussing management/treatment options, he elected to proceed with surgical treatment.  Hospital Course: MENDELL BONTEMPO was taken to the operating room on 06/24/2013 and underwent a robotic assisted laparoscopic radical prostatectomy. He tolerated this procedure well and without complications. Postoperatively, he was able to be transferred to a regular hospital room following recovery from anesthesia.  He was able to begin ambulating the night of surgery. He remained hemodynamically stable overnight.  He had excellent urine output with appropriately minimal output from his pelvic drain and his pelvic drain was removed on POD #1.  He was transitioned to oral pain medication, tolerated a clear liquid diet, and had met all discharge criteria and was able to be discharged home later on POD#1.  Laboratory values:  Recent Labs  06/24/13 1301 06/25/13 0340  HGB 15.7 14.4  HCT 45.9 42.2    Disposition: Home  Discharge instruction: He was instructed to be ambulatory but to refrain from heavy lifting, strenuous activity, or driving. He was instructed on urethral catheter care.  Discharge medications:     Medication List    STOP taking these medications       aspirin EC 81 MG tablet     multivitamin with minerals Tabs tablet      TAKE these medications       ciprofloxacin 500 MG tablet  Commonly known as:  CIPRO  Take 1 tablet (500 mg total) by mouth 2 (two) times daily. Start day prior to office visit for foley removal     fexofenadine 180 MG tablet  Commonly known as:  ALLEGRA  Take 180 mg by mouth daily.     HYDROcodone-acetaminophen 5-325 MG per tablet  Commonly known as:   NORCO  Take 1-2 tablets by mouth every 6 (six) hours as needed.     hydroxypropyl methylcellulose 2.5 % ophthalmic solution  Commonly known as:  ISOPTO TEARS  Place 1 drop into both eyes 3 (three) times daily as needed for dry eyes.        Followup: He will followup in 1 week for catheter removal and to discuss his surgical pathology results.

## 2017-07-14 LAB — PULMONARY FUNCTION TEST
DLCO: 33.68 ml/mmHg sec
FEV1/FVC: 75 %
FEV1: 3015 L
FVC: 4.22 L
TLC: 7.24

## 2017-09-19 ENCOUNTER — Other Ambulatory Visit: Payer: Self-pay

## 2017-09-19 ENCOUNTER — Encounter (HOSPITAL_COMMUNITY): Payer: Self-pay | Admitting: Hematology

## 2017-09-19 ENCOUNTER — Inpatient Hospital Stay (HOSPITAL_COMMUNITY): Payer: Medicare HMO | Attending: Hematology | Admitting: Hematology

## 2017-09-19 VITALS — BP 139/69 | HR 62 | Temp 97.8°F | Resp 18 | Ht 73.0 in | Wt 209.0 lb

## 2017-09-19 DIAGNOSIS — C3492 Malignant neoplasm of unspecified part of left bronchus or lung: Secondary | ICD-10-CM

## 2017-09-19 DIAGNOSIS — Z8041 Family history of malignant neoplasm of ovary: Secondary | ICD-10-CM | POA: Insufficient documentation

## 2017-09-19 DIAGNOSIS — C7A8 Other malignant neuroendocrine tumors: Secondary | ICD-10-CM | POA: Insufficient documentation

## 2017-09-19 DIAGNOSIS — Z8546 Personal history of malignant neoplasm of prostate: Secondary | ICD-10-CM | POA: Insufficient documentation

## 2017-09-19 DIAGNOSIS — Z7901 Long term (current) use of anticoagulants: Secondary | ICD-10-CM | POA: Insufficient documentation

## 2017-09-19 DIAGNOSIS — Z801 Family history of malignant neoplasm of trachea, bronchus and lung: Secondary | ICD-10-CM | POA: Diagnosis not present

## 2017-09-19 DIAGNOSIS — Z950 Presence of cardiac pacemaker: Secondary | ICD-10-CM | POA: Diagnosis not present

## 2017-09-19 DIAGNOSIS — Z87891 Personal history of nicotine dependence: Secondary | ICD-10-CM | POA: Diagnosis not present

## 2017-09-19 DIAGNOSIS — I4891 Unspecified atrial fibrillation: Secondary | ICD-10-CM | POA: Insufficient documentation

## 2017-09-19 DIAGNOSIS — C3490 Malignant neoplasm of unspecified part of unspecified bronchus or lung: Secondary | ICD-10-CM

## 2017-09-19 DIAGNOSIS — C7B8 Other secondary neuroendocrine tumors: Secondary | ICD-10-CM | POA: Diagnosis not present

## 2017-09-19 DIAGNOSIS — Z7189 Other specified counseling: Secondary | ICD-10-CM | POA: Insufficient documentation

## 2017-09-19 DIAGNOSIS — Z803 Family history of malignant neoplasm of breast: Secondary | ICD-10-CM | POA: Insufficient documentation

## 2017-09-19 NOTE — Assessment & Plan Note (Signed)
1.  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 -PET CT scan on 07/27/2017 shows left lung mass with SUV of 14.6, probable metastatic left hilar lymph node, no other metastatic disease. - On 08/17/2017, underwent VATS left upper lobectomy, mediastinal lymphadenectomy. - Pathology shows 2.6 x 2.1 x 2 cm large cell neuroendocrine carcinoma, 2/9 peribronchial lymph nodes positive, station 5, 6, 11 lymph nodes negative, negative visceral pleural involvement - I have discussed extensively with the patient and his wife about how his new diagnosis.  Because of the worse prognosis compared with other forms of non-small cell lung cancer, I have recommended adjuvant chemotherapy that may offer the best chance to improve survival.  Usually these cancers are treated similar to small cell lung cancer.  Hence I have recommended combination of platinum and etoposide for 3 cycles.  He has mild degree of chronic renal insufficiency and a compromised hearing.  Hence I do not think he is a candidate for cisplatin.  We will recommend carboplatin AUC 5-6 on day 1 and etoposide 100 mg/m (day 1-3) every 21 days with Neulasta support for 4 cycles.  We talked about side effects in detail. -I have also recommended MRI of the brain to complete the staging work-up.  Since he has a pacemaker, we will change it to CT scan of the brain with and without contrast. -Patient is still feeling low energy.  He would like to delay his therapy until August 12 as he is going on a vacation.  2.  Family history: - Patient has personal history of prostate cancer which was resected in May 15, 2013.  Patient's brother died of lung cancer and was a smoker.  Sister died of ovarian cancer.  Hence I have recommended geneticist evaluation for BRCA and other mutation testing.  We will arrange it at next visit.  3.  Atrial fibrillation: -He has a pacemaker.   He is also continuing Eliquis twice daily.

## 2017-09-19 NOTE — Progress Notes (Signed)
START OFF PATHWAY REGIMEN - [Other Dx]   [id number]:Carboplatin AUC=5 D1 + Etoposide 120 mg/m2 D1-3 q28 Days:   A cycle is every 28 days:     Carboplatin      Etoposide   **Always confirm dose/schedule in your pharmacy ordering system**  Patient Characteristics: Intent of Therapy: Curative Intent, Discussed with Patient

## 2017-09-19 NOTE — Patient Instructions (Signed)
[facility] Discharge Instructions  Today you saw Dr. K.   Thank you for choosing [facility] at [facility] to provide your oncology and hematology care.  To afford each patient quality time with our provider, please arrive at least 15 minutes before your scheduled appointment time.   If you have a lab appointment with the Cancer Center please come in thru the  Main Entrance and check in at the main information desk  You need to re-schedule your appointment should you arrive 10 or more minutes late.  We strive to give you quality time with our providers, and arriving late affects you and other patients whose appointments are after yours.  Also, if you no show three or more times for appointments you may be dismissed from the clinic at the providers discretion.     Again, thank you for choosing [facility].  Our hope is that these requests will decrease the amount of time that you wait before being seen by our physicians.       _____________________________________________________________  Should you have questions after your visit to  Cancer Center, please contact our office at [phone] between the hours of 8:30 a.m. and 4:30 p.m.  Voicemails left after 4:30 p.m. will not be returned until the following business day.  For prescription refill requests, have your pharmacy contact our office.       Resources For Cancer Patients and their Caregivers ? American [employer]: Can assist with transportation, wigs, general needs, runs Look Good Feel Better.        1-888-227-6333 ? Cancer Care: Provides financial assistance, online support groups, medication/co-pay assistance.  1-800-813-HOPE (4673) ? [facility] Assists [place] cancer patients and their families through emotional , educational and financial support.  336-427-4357 ? [place] DSS Where to apply for food  stamps, Medicaid and utility assistance. 336-342-1394 ? [facility]: Transportation to medical appointments. 336-347-2287 ? Social Security Administration: May apply for disability if have a Stage IV cancer. 336-342-7796 1-800-772-1213 ? [place] [facility]ging, Disability and Transit Services: Assists with nutrition, care and transit needs. 336-349-2343  Cancer Center Support Programs:   > Cancer Support Group  2nd Tuesday of the month 1pm-2pm, Journey Room   > Creative Journey  3rd Tuesday of the month 1130am-1pm, Journey Room    

## 2017-09-19 NOTE — Progress Notes (Signed)
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  HISTORY OF PRESENTING ILLNESS:  Douglas Baxter 77 y.o. 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  Postprocedure chest x-ray showed left lung nodule.  This was followed up with a CT scan done at [facility].  The mass was confirmed and he was referred to Dr. Hulen Skains in [place].  A PET CT scan on 07/27/2017 shows left lung mass with SUV of 14.6 and probable metastatic left hilar lymph node.  No other metastatic disease was seen.  On 08/17/2017 he underwent VATS and left upper lobectomy, mediastinal lymphadenectomy.  This showed 2.6 x 2.1 x 2 cm lung mass consistent with large cell neuroendocrine carcinoma.  Margins were negative.  2 out of 9 peribronchial lymph nodes were positive.  Patient is recuperating from surgery.  Energy levels are slowly coming back to normal.  He smoked for 25 pack years and quit in 1996.  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  Otherwise he is very healthy and lives at home with his wife.  He is independent of ADLs and IADLs.  MEDICAL HISTORY:  Past Medical History:  Diagnosis Date  . Arthritis   . Cancer [facility])    prostate  . Hematuria   . Nocturia     SURGICAL HISTORY: Past Surgical History:  Procedure Laterality Date  . [place]   lower back  . CHOLECYSTECTOMY  2000  . LYMPHADENECTOMY Bilateral 06/24/2013   Procedure: LYMPHADENECTOMY;  Surgeon: [name], MD;  Location: [facility];  Service: Urology;  Laterality: Bilateral;  . MENISCUS REPAIR Right jan 2014  . PROSTATE  BIOPSY  Apr 26, 2013  . ROBOT ASSISTED LAPAROSCOPIC RADICAL PROSTATECTOMY N/A 06/24/2013   Procedure: ROBOTIC ASSISTED LAPAROSCOPIC RADICAL PROSTATECTOMY LEVEL 2;  Surgeon: [name], MD;  Location: [facility];  Service: Urology;  Laterality: N/A;    SOCIAL HISTORY: Social History   Socioeconomic History  . Marital status: Married    Spouse name: Not on file  . Number of children: Not on file  . Years of education: Not on file  . Highest education level: Not on file  Occupational History  . Not on file  Social Needs  . Financial resource strain: Not on file  . Food insecurity:    Worry: Not on file    Inability: Not on file  . Transportation needs:    Medical: Not on file    Non-medical: Not on file  Tobacco Use  . Smoking status: Former Smoker    Packs/day: 1.00    Types: Cigarettes    Last attempt to quit: 03/07/1978    Years since quitting: 39.5  . Smokeless tobacco: Never Used  Substance and Sexual Activity  . Alcohol use: Yes    Comment:  occasional  . Drug use: No  . Sexual activity: Not on file  Lifestyle  . Physical activity:    Days per week: Not on file    Minutes per session: Not on file  . Stress: Not on file  Relationships  . Social connections:  Talks on phone: Not on file    Gets together: Not on file    Attends religious service: Not on file    Active member of club or organization: Not on file    Attends meetings of clubs or organizations: Not on file    Relationship status: Not on file  . Intimate partner violence:    Fear of current or ex partner: Not on file    Emotionally abused: Not on file    Physically abused: Not on file    Forced sexual activity: Not on file  Other Topics Concern  . Not on file  Social History Narrative  . Not on file    FAMILY HISTORY: Family History  Problem Relation Age of Onset  . Deep vein thrombosis Mother   . Heart failure Father   . Ovarian cancer Sister   . Lung cancer Brother     ALLERGIES:  is  allergic to sulfa antibiotics.  MEDICATIONS:  Current Outpatient Medications  Medication Sig Dispense Refill  . apixaban (ELIQUIS) 5 MG TABS tablet Take 5 mg by mouth 2 (two) times daily.    . ciprofloxacin (CIPRO) 500 MG tablet Take 1 tablet (500 mg total) by mouth 2 (two) times daily. Start day prior to office visit for foley removal 6 tablet 0  . fexofenadine (ALLEGRA) 180 MG tablet Take 180 mg by mouth daily.    Marland Kitchen HYDROcodone-acetaminophen (NORCO) 5-325 MG per tablet Take 1-2 tablets by mouth every 6 (six) hours as needed. 30 tablet 0  . hydroxypropyl methylcellulose (ISOPTO TEARS) 2.5 % ophthalmic solution Place 1 drop into both eyes 3 (three) times daily as needed for dry eyes.     No current facility-administered medications for this visit.     REVIEW OF SYSTEMS:   Constitutional: Denies fevers, chills or abnormal night sweats.  Mild fatigue present. Eyes: Denies blurriness of vision, double vision or watery eyes Ears, nose, mouth, throat, and face: Denies mucositis or sore throat Respiratory: Denies cough, dyspnea or wheezes Cardiovascular: Denies palpitation, chest discomfort or lower extremity swelling Gastrointestinal:  Denies nausea, heartburn or change in bowel habits Skin: Denies abnormal skin rashes Lymphatics: Denies new lymphadenopathy or easy bruising Neurological:Denies numbness, tingling or new weaknesses Behavioral/Psych: Mood is stable, no new changes  All other systems were reviewed with the patient and are negative.  PHYSICAL EXAMINATION: ECOG PERFORMANCE STATUS: 1 - Symptomatic but completely ambulatory  Vitals:   09/19/17 1315  BP: 139/69  Pulse: 62  Resp: 18  Temp: 97.8 F (36.6 C)  SpO2: 97%   Filed Weights   09/19/17 1315  Weight: 209 lb (94.8 kg)    GENERAL:alert, no distress and comfortable.  SKIN: skin color, texture, turgor are normal, no rashes or significant lesions EYES: normal, conjunctiva are pink and non-injected, sclera  clear OROPHARYNX:no exudate, no erythema and lips, buccal mucosa, and tongue normal  NECK: supple, thyroid normal size, non-tender, without nodularity LYMPH:  no palpable lymphadenopathy in the cervical, axillary or inguinal LUNGS: clear to auscultation and percussion with normal breathing effort HEART: regular rate & rhythm and no murmurs and no lower extremity edema ABDOMEN:abdomen soft, non-tender and normal bowel sounds PSYCH: alert & oriented x 3 with fluent speech   LABORATORY DATA:  I have reviewed the data as listed Lab Results  Component Value Date   WBC 8.9 06/17/2013   HGB 14.4 06/25/2013   HCT 42.2 06/25/2013   MCV 88.9 06/17/2013   PLT 217 06/17/2013  Chemistry      Component Value Date/Time   NA 140 06/17/2013 1350   K 4.7 06/17/2013 1350   CL 102 06/17/2013 1350   CO2 26 06/17/2013 1350   BUN 17 06/17/2013 1350   CREATININE 1.16 06/17/2013 1350      Component Value Date/Time   CALCIUM 9.8 06/17/2013 1350       RADIOGRAPHIC STUDIES: I have independently reviewed PET CT scan dated 07/27/2017 and discussed the results with the patient and his wife.  ASSESSMENT & PLAN:  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HCC) 1.  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 -PET CT scan on 07/27/2017 shows left lung mass with SUV of 14.6, probable metastatic left hilar lymph node, no other metastatic disease. - On 08/17/2017, underwent VATS left upper lobectomy, mediastinal lymphadenectomy. - Pathology shows 2.6 x 2.1 x 2 cm large cell neuroendocrine carcinoma, 2/9 peribronchial lymph nodes positive, station 5, 6, 11 lymph nodes negative, negative visceral pleural involvement - I have discussed extensively with the patient and his wife about how his new diagnosis.  Because of the worse prognosis compared with other forms of non-small cell lung cancer, I have recommended  adjuvant chemotherapy that may offer the best chance to improve survival.  Usually these cancers are treated similar to small cell lung cancer.  Hence I have recommended combination of platinum and etoposide for 3 cycles.  He has mild degree of chronic renal insufficiency and a compromised hearing.  Hence I do not think he is a candidate for cisplatin.  We will recommend carboplatin AUC 5-6 on day 1 and etoposide 100 mg/m (day 1-3) every 21 days with Neulasta support for 4 cycles.  We talked about side effects in detail. -I have also recommended MRI of the brain to complete the staging work-up.  Since he has a pacemaker, we will change it to CT scan of the brain with and without contrast. -Patient is still feeling low energy.  He would like to delay his therapy until August 12 as he is going on a vacation.  2.  Family history: - Patient has personal history of prostate cancer which was resected in May 02, 2013.  Patient's brother died of lung cancer and was a smoker.  Sister died of ovarian cancer.  Hence I have recommended geneticist evaluation for BRCA and other mutation testing.  We will arrange it at next visit.  3.  Atrial fibrillation: -He has a pacemaker.  He is also continuing Eliquis twice daily.  Orders Placed This Encounter  Procedures  . CT Head W Wo Contrast    Standing Status:   Future    Standing Expiration Date:   09/19/2018    Order Specific Question:   ** REASON FOR EXAM (FREE TEXT)    Answer:   large cell lung cancer    Order Specific Question:   If indicated for the ordered procedure, I authorize the administration of contrast media per Radiology protocol    Answer:   Yes    Order Specific Question:   Preferred imaging location?    Answer:   [facility]    Order Specific Question:   Radiology Contrast Protocol - do NOT remove file path    Answer:   [email or web address]    All questions were answered. The patient knows to call the clinic with any  problems, questions or concerns. Total time spent is 60 minutes with  more than 50% of the time spent face-to-face discussing new diagnosis, prognosis, further work-up, treatment options, side effects, and coordination of care.     Derek Jack, MD 09/19/2017 5:40 PM

## 2017-09-25 ENCOUNTER — Telehealth (HOSPITAL_COMMUNITY): Payer: Self-pay | Admitting: General Practice

## 2017-09-25 NOTE — Telephone Encounter (Signed)
[facility] CSW Progress Note  Call to new patient to introduce CSW and support resources.  Unable to reach patient, left VM w contact information and resources available on home VM.  Edwyna Shell, LCSW Clinical Social Worker Phone:  [phone]

## 2017-10-03 MED ORDER — PROCHLORPERAZINE MALEATE 10 MG PO TABS: 10.0000 mg | ORAL_TABLET | Freq: Four times a day (QID) | ORAL | 1 refills | Status: DC | PRN

## 2017-10-03 NOTE — Patient Instructions (Signed)
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  You are going to be treated with curative intent.  We are going to treat you with chemotherapy and it will be given through your port a cath.  The medications are VePesid or Vp16 (etoposide), Paraplatin (carboplatin). You will come on day 1 for both medications and then day 2 and 3 you will get Vp16 only.  You will come back on day 5 to get Neulasta/Udenyca.  This medication will help boost your white blood cell count.  These 5 days are considered 1 cycle.  There will be 21 days between each cycle.  You will see the doctor regularly throughout treatment.  We monitor your lab work prior to every treatment.  The doctor monitors your response to treatment by the way you are feeling, your blood work, and scans periodically.  During your treatment there will be wait times.  It is about 30 minutes to 1 hour wait for lab work to result.  And there is a wait time for pharmacy to prepare your medications.    You will receive the following pre-medications prior to receiving chemotherapy:  Aloxi - high powered nausea/vomiting prevention medication used for chemotherapy patients.   Dexamethasone - steroid - given to reduce the risk of you having an allergic type reaction to the chemotherapy. Dexamethasone can cause you to feel energized, nervous/anxious/jittery, make you have trouble sleeping, and/or make you feel hot/flushed in the face/neck and/or look pink/red in the face/neck.    Neulasta - this medication is not chemo but being given because you have had chemo. This medication works by boosting your bone marrow's supply of white blood cells. White blood cells are what protect our bodies against infection. The medication is given in the form of a subcutaneous injection. It is given in the fatty tissue of the back of your  arm.  The major side effect of this medication is bone or muscle pain. The drug of choice to relieve or lessen the pain is Aleve or Ibuprofen. If a physician has ever told you not to take Aleve or Ibuprofen - then don't take it. You should then take Tylenol/acetaminophen. Take either medication as the bottle directs you to.  The level of pain you experience as a result of this injection can range from none, to mild or moderate, or severe. Please let us know if you develop moderate or severe bone pain.   You can take Claritin 10 mg over the counter for a few days after receiving neulasta to help with the bone aches and pains.    **DO NOT expose the Neulasta On-body injector to diagnostic imaging (CT scans, MRI, Ultrasound, X-ray), radiation treatment, or oxygen rich environments, such as hyperbaric chambers. **    POTENTIAL SIDE EFFECTS OF TREATMENT:  Etoposide (Generic Name) Other Names: VePesid, VP-16  About This Drug Etoposide is a drug used to treat cancer. This drug is given in the vein (IV) or by mouth.  This drug will take one hour to infuse.    Possible Side Effects (More Common) . Nausea and throwing up (vomiting). These symptoms may happen within one to six hours after getting this drug and may last up to 72 hours. Medicines are available to stop or lessen these side effects . Bone marrow depression. This is a decrease in the number of white blood cells, red blood cells, and  platelets. This may raise your risk of infection, make you tired and weak (fatigue), and raise your risk of bleeding. . Decreased appetite (decreased hunger) . Hair loss. Most patients lost hair on their scalp and body. You may notice hair thinning five to seven days after getting this drug. Often hair loss is temporary; your hair should grow back when treatment is done. . Fatigue  Possible Side Effects (Less Common) . Loose bowel movements (diarrhea) that may last for several days . Soreness of the mouth and  throat. You may have red areas, white patches, or sores that hurt. . Increased total bilirubin in your blood. This may mean that you have changes in your liver function. Your blood work will be checked by your doctor. . Effects on the nerves are called peripheral neuropathy. You may feel numbness, tingling, or pain in your hands and feet. It may be hard for you to button your clothes, open jars, or walk as usual. The effect on the nerves may get worse with more doses of the drug. These effects get better in some people after the drug is stopped but it does not get better in all people. . Rash . Blood pressure may be low while getting this drug in an IV.  Allergic Reactions Serious allergic reactions including anaphylaxis are rare. While you are getting this drug in your vein (IV), tell your nurse right away if you have any of these symptoms of an allergic reaction: . trouble catching your breath . feeling like your tongue or throat are swelling . feeling your heart beat quickly or in a not normal way (palpitations) . feeling dizzy or lightheaded . flushing, itching, rash, and/or hives  Treating Side Effects . If you are getting this drug in an IV, tell your nurse know right away if you are feeling lightheaded or dizzy. . Talk with your nurse about getting a wig before you lose your hair. Also, call the [facility] at 800-ACS-2345 to find out information about the "Look Good, Feel Better" program close to where you live. It is a free program where women getting chemotherapy can learn about wigs, turbans and scarves as well as makeup techniques and skin and nail care. . Drink 6-8 cups of fluids each day unless your doctor has told you to limit your fluid intake due to some other health problem. A cup is 8 ounces of fluid. If you throw up or have loose bowel movements, you should drink more fluids so that you do not become dehydrated (lack water in the body from losing too much fluid). .  Mouth care is very important. Your mouth care should consist of regular, gentle cleaning of your teeth or dentures and rinsing your mouth with a mixture of  teaspoon of salt in 8 ounces of water or  teaspoon of sodium bicarbonate (baking soda) in 8 ounces of water. This should be done at least after every meal and at bedtime. . If you have mouth sores, avoid mouthwash that contains alcohol. Also avoid alcohol and smoking because they can bother your mouth and throat. . If you get a rash do not put anything on it unless your doctor or nurse says you may. Keep the area around the rash clean and dry. Let your doctor know right away if you get a rash while on this medicine. . Ask your doctor or nurse about medicine that is available to help stop or lessen nausea or throwing up. . Be careful when cooking,  walking, and handling sharp objects and hot liquids.  Food and Drug Interactions There are no known interactions of etoposide with food. This drug may interact with other medication. Tell your doctor and pharmacist about all the medication and dietary supplements (vitamins, minerals, herbs and others) that you are taking at this time. The safety and effectiveness of dietary supplements and alternative diets are often unknown. Using these might affect your cancer or interfere with your treatment. Until more is known, you should not use dietary supplements or alternative diets without your cancer doctor's help.  Important Information . Take etoposide pills one hour before eating or two to three hours after eating. . Store this drug in the refrigerator. . Missed dose: If you miss a dose, take it as soon as you remember, unless it is close to the time of your next dose. If it is close to your next dose, just take the next dose at your normal time. Do not take 2 doses at once. Do not take extra doses.  When to Call the Doctor Call your doctor or nurse right away if you have any of these symptoms: . Trouble  breathing or feeling short of breath . Fever of 100.5 F (38 C) or higher . Chills . Easy bleeding or bruising . Rash or itching . Feeling dizzy or lightheaded . Feeling that your heart is beating in a fast or not normal way (palpitations) . Loose bowel movements (diarrhea) more than 4 times a day or diarrhea with weakness or feeling lightheaded . Feeling confused . Nausea that stops you from eating or drinking . Throwing up more than 3 times a day Call your doctor or nurse as soon as possible if you have any of these symptoms: . Numbness, tingling, decreased feeling or weakness in fingers, toes, arms, or legs . Trouble walking or changes in the way you walk . Pain in your mouth or throat that makes it hard to eat or drink  Sexual Problems and Reproductive Concerns . Infertility warning: Sexual problems and reproduction concerns may happen. In both men and women, this drug may affect your ability to have children. This cannot be determined before your treatment. Talk with your doctor or nurse if you plan to have children. Ask for information on sperm or egg banking. . In men, this drug may interfere with your ability to make sperm, but it should not change your ability to have sexual relations. . In women, menstrual bleeding may become irregular or stop while you are getting this drug. Do not assume that you cannot become pregnant if you do not have a menstrual period. . Women may go through signs of menopause (change of life) like vaginal dryness or itching. Vaginal lubricants can be used to lessen vaginal dryness, itching, and pain during sexual relations. . Genetic counseling is available for you to talk about the effects of this drug therapy on future pregnancies. Also, a genetic counselor can look at the possible risk of problems in the unborn baby due to this medicine if an exposure happens during pregnancy. . Pregnancy warning: This drug may have harmful effects on the unborn child, so  effective methods of birth control should be used during your cancer treatment. Ask your doctor or nurse about effective methods of birth control. . Breast feeding warning: It is not known if this drug passes into breast milk. For this reason, women should talk to their doctor about the risks and benefits of breast feeding during treatment with this  drug because this drug may enter the breast milk and badly harm a breast feeding baby.         Carboplatin (Generic Name) Other Names: Paraplatin, CBDCA  About This Drug Carboplatin is a drug used to treat cancer. This drug is given in the vein (IV).  This drug will take 30 minutes to infuse.    Possible Side Effects (More Common) . Nausea and throwing up (vomiting). These symptoms may happen within a few hours after your treatment and may last up to 24 hours. Medicines are available to stop or lessen these side effects. . Bone marrow depression. This is a decrease in the number of white blood cells, red blood cells, and platelets. This may raise your risk of infection, make you tired and weak (fatigue), and raise your risk of bleeding. . Soreness of the mouth and throat. You may have red areas, white patches, or sores that hurt. . This drug may affect how your kidneys work. Your kidney function will be checked as needed. . Electrolyte changes. Your blood will be checked for electrolyte changes as needed.  Possible Side Effects (Less Common) . Hair loss. Some patients lose their hair on the scalp and body. You may notice your hair thinning seven to 14 days after getting this drug. . Effects on the nerves are called peripheral neuropathy. You may feel numbness, tingling, or pain in your hands and feet. It may be hard for you to button your clothes, open jars, or walk as usual. The effect on the nerves may get worse with more doses of the drug. These effects get better in some people after the drug is stopped but it does not get better in all  people. . Loose bowel movements (diarrhea) that may last for several days . Decreased hearing or ringing in the ears . Changes in the way food and drinks taste . Changes in liver function. Your liver function will be checked as needed.  Allergic Reactions Serious allergic reactions including anaphylaxis are rare. While you are getting this drug in your vein (IV), tell your nurse right away if you have any of these symptoms of an allergic reaction: . Trouble catching your breath . Feeling like your tongue or throat are swelling . Feeling your heart beat quickly or in a not normal way (palpitations) . Feeling dizzy or lightheaded . Flushing, itching, rash, and/or hives Treating Side Effects . Drink 6-8 cups of fluids each day unless your doctor has told you to limit your fluid intake due to some other health problem. A cup is 8 ounces of fluid. If you throw up or have loose bowel movements, you should drink more fluids so that you do not become dehydrated (lack water in the body from losing too much fluid). . Mouth care is very important. Your mouth care should consist of routine, gentle cleaning of your teeth or dentures and rinsing your mouth with a mixture of 1/2 teaspoon of salt in 8 ounces of water or  teaspoon of baking soda in 8 ounces of water. This should be done at least after each meal and at bedtime. . If you have mouth sores, avoid mouthwash that has alcohol. Avoid alcohol and smoking because they can bother your mouth and throat. . If you have numbness and tingling in your hands and feet, be careful when cooking, walking, and handling sharp objects and hot liquids. . Talk with your nurse about getting a wig before you lose your hair. Also, call the  [employer] at 800-ACS-2345 to find out information about the "Look Good, Feel Better" program close to where you live. It is a free program where women getting chemotherapy can learn about wigs, turbans and scarves as well as  makeup techniques and skin and nail care.  Food and Drug Interactions There are no known interactions of carboplatin with food. This drug may interact with other medicines. Tell your doctor and pharmacist about all the medicines and dietary supplements (vitamins, minerals, herbs and others) that you are taking at this time. The safety and use of dietary supplements and alternative diets are often not known. Using these might affect your cancer or interfere with your treatment. Until more is known, you should not use dietary supplements or alternative diets without your cancer doctor's help.  When to Call the Doctor Call your doctor or nurse right away if you have any of these symptoms: . Fever of 100.5 F (38 C) or above; chills . Bleeding or bruising that is not normal . Wheezing or trouble breathing . Nausea that stops you from eating or drinking . Throwing up more than once a day . Rash or itching . Loose bowel movements (diarrhea) more than four times a day or diarrhea with weakness or feeling lightheaded . Call your doctor or nurse as soon as possible if any of these symptoms happen: . Numbness, tingling, decreased feeling or weakness in fingers, toes, arms, or legs . Change in hearing, ringing in the ears . Blurred vision or other changes in eyesight . Decreased urine . Yellowing of skin or eyes  Sexual Problems and Reproductive Concerns Sexual problems and reproduction concerns may happen. In both men and women, this drug may affect your ability to have children. This cannot be determined before your treatment. Talk with your doctor or nurse if you plan to have children. Ask for information on sperm or egg banking. In men, this drug may interfere with your ability to make sperm, but it should not change your ability to have sexual relations. In women, menstrual bleeding may become irregular or stop while you are getting this drug. Do not assume that you cannot become pregnant if you do  not have a menstrual period. Women may go through signs of menopause (change of life) like vaginal dryness or itching. Vaginal lubricants can be used to lessen vaginal dryness, itching, and pain during sexual relations. Genetic counseling is available for you to talk about the effects of this drug therapy on future pregnancies. Also, a genetic counselor can look at the possible risk of problems in the unborn baby due to this medicine if an exposure happens during pregnancy. . Pregnancy warning: This drug may have harmful effects on the unborn child, so effective methods of birth control should be used during your cancer treatment. . Breast feeding warning: It is not known if this drug passes into breast milk. For this reason, women should talk to their doctor about the risks and benefits of breast feeding during treatment with this drug because this drug may enter the breast milk and badly harm a breast feeding baby.    SELF CARE ACTIVITIES WHILE ON CHEMOTHERAPY:  Hydration Increase your fluid intake 48 hours prior to treatment and drink at least 8 to 12 cups (64 ounces) of water/decaffeinated beverages per day after treatment. You can still have your cup of coffee or soda but these beverages do not count as part of your 8 to 12 cups that you need to drink daily.  No alcohol intake.  Medications Continue taking your normal prescription medication as prescribed.  If you start any new herbal or new supplements please let us know first to make sure it is safe.  Mouth Care Have teeth cleaned professionally before starting treatment. Keep dentures and partial plates clean. Use soft toothbrush and do not use mouthwashes that contain alcohol. Biotene is a good mouthwash that is available at most pharmacies or may be ordered by calling [phone]. Use warm salt water gargles (1 teaspoon salt per 1 quart warm water) before and after meals and at bedtime. Or you may rinse with 2 tablespoons of three-percent  hydrogen peroxide mixed in eight ounces of water. If you are still having problems with your mouth or sores in your mouth please call the clinic. If you need dental work, please let the doctor know before you go for your appointment so that we can coordinate the best possible time for you in regards to your chemo regimen. You need to also let your dentist know that you are actively taking chemo. We may need to do labs prior to your dental appointment.  Skin Care Always use sunscreen that has not expired and with SPF (Sun Protection Factor) of 50 or higher. Wear hats to protect your head from the sun. Remember to use sunscreen on your hands, ears, face, & feet.  Use good moisturizing lotions such as udder cream, eucerin, or even Vaseline. Some chemotherapies can cause dry skin, color changes in your skin and nails.    . Avoid long, hot showers or baths. . Use gentle, fragrance-free soaps and laundry detergent. . Use moisturizers, preferably creams or ointments rather than lotions because the thicker consistency is better at preventing skin dehydration. Apply the cream or ointment within 15 minutes of showering. Reapply moisturizer at night, and moisturize your hands every time after you wash them.  Hair Loss (if your doctor says your hair will fall out)  . If your doctor says that your hair is likely to fall out, decide before you begin chemo whether you want to wear a wig. You may want to shop before treatment to match your hair color. . Hats, turbans, and scarves can also camouflage hair loss, although some people prefer to leave their heads uncovered. If you go bare-headed outdoors, be sure to use sunscreen on your scalp. . Cut your hair short. It eases the inconvenience of shedding lots of hair, but it also can reduce the emotional impact of watching your hair fall out. . Don't perm or color your hair during chemotherapy. Those chemical treatments are already damaging to hair and can enhance hair  loss. Once your chemo treatments are done and your hair has grown back, it's OK to resume dyeing or perming hair. With chemotherapy, hair loss is almost always temporary. But when it grows back, it may be a different color or texture. In older adults who still had hair color before chemotherapy, the new growth may be completely gray.  Often, new hair is very fine and soft.  Infection Prevention Please wash your hands for at least 30 seconds using warm soapy water. Handwashing is the #1 way to prevent the spread of germs. Stay away from sick people or people who are getting over a cold. If you develop respiratory systems such as green/yellow mucus production or productive cough or persistent cough let us know and we will see if you need an antibiotic. It is a good idea to keep a pair of  gloves on when going into grocery stores/[employer] to decrease your risk of coming into contact with germs on the carts, etc. Carry alcohol hand gel with you at all times and use it frequently if out in public. If your temperature reaches 100.5 or higher please call the clinic and let us know.  If it is after hours or on the weekend please go to the ER if your temperature is over 100.5.  Please have your own personal thermometer at home to use.    Sex and bodily fluids If you are going to have sex, a condom must be used to protect the person that isn't taking chemotherapy. Chemo can decrease your libido (sex drive). For a few days after chemotherapy, chemotherapy can be excreted through your bodily fluids.  When using the toilet please close the lid and flush the toilet twice.  Do this for a few day after you have had chemotherapy.   Effects of chemotherapy on your sex life Some changes are simple and won't last long. They won't affect your sex life permanently. Sometimes you may feel: . too tired . not strong enough to be very active . sick or sore  . not in the mood . anxious or low Your anxiety might not seem related  to sex. For example, you may be worried about the cancer and how your treatment is going. Or you may be worried about money, or about how you family are coping with your illness. These things can cause stress, which can affect your interest in sex. It's important to talk to your partner about how you feel. Remember - the changes to your sex life don't usually last long. There's usually no medical reason to stop having sex during chemo. The drugs won't have any long term physical effects on your performance or enjoyment of sex. Cancer can't be passed on to your partner during sex  Contraception It's important to use reliable contraception during treatment. Avoid getting pregnant while you or your partner are having chemotherapy. This is because the drugs may harm the baby. Sometimes chemotherapy drugs can leave a man or woman infertile.  This means you would not be able to have children in the future. You might want to talk to someone about permanent infertility. It can be very difficult to learn that you may no longer be able to have children. Some people find counselling helpful. There might be ways to preserve your fertility, although this is easier for men than for women. You may want to speak to a fertility expert. You can talk about sperm banking or harvesting your eggs. You can also ask about other fertility options, such as donor eggs. If you have or have had breast cancer, your doctor might advise you not to take the contraceptive pill. This is because the hormones in it might affect the cancer.  It is not known for sure whether or not chemotherapy drugs can be passed on through semen or secretions from the vagina. Because of this some doctors advise people to use a barrier method if you have sex during treatment. This applies to vaginal, anal or oral sex. Generally, doctors advise a barrier method only for the time you are actually having the treatment and for about a week after your  treatment. Advice like this can be worrying, but this does not mean that you have to avoid being intimate with your partner. You can still have close contact with your partner and continue to enjoy sex.  Animals  If you have cats or birds we just ask that you not change the litter or change the cage.  Please have someone else do this for you while you are on chemotherapy.   Food Safety During and After Cancer Treatment Food safety is important for people both during and after cancer treatment. Cancer and cancer treatments, such as chemotherapy, radiation therapy, and stem cell/bone marrow transplantation, often weaken the immune system. This makes it harder for your body to protect itself from foodborne illness, also called food poisoning. Foodborne illness is caused by eating food that contains harmful bacteria, parasites, or viruses.  Foods to avoid Some foods have a higher risk of becoming tainted with bacteria. These include: Marland Kitchen Unwashed fresh fruit and vegetables, especially leafy vegetables that can hide dirt and other contaminants . Raw sprouts, such as alfalfa sprouts . Raw or undercooked beef, especially ground beef, or other raw or undercooked meat and poultry . Fatty, fried, or spicy foods immediately before or after treatment.  These can sit heavy on your stomach and make you feel nauseous. . Raw or undercooked shellfish, such as oysters. . Sushi and sashimi, which often contain raw fish.  . Unpasteurized beverages, such as unpasteurized fruit juices, raw milk, raw yogurt, or cider . Undercooked eggs, such as soft boiled, over easy, and poached; raw, unpasteurized eggs; or foods made with raw egg, such as homemade raw cookie dough and homemade mayonnaise Simple steps for food safety Shop smart. . Do not buy food stored or displayed in an unclean area. . Do not buy bruised or damaged fruits or vegetables. . Do not buy cans that have cracks, dents, or bulges. . Pick up foods that can  spoil at the end of your shopping trip and store them in a cooler on the way home. Prepare and clean up foods carefully. . Rinse all fresh fruits and vegetables under running water, and dry them with a clean towel or paper towel. . Clean the top of cans before opening them. . After preparing food, wash your hands for 20 seconds with hot water and soap. Pay special attention to areas between fingers and under nails. . Clean your utensils and dishes with hot water and soap. Marland Kitchen Disinfect your kitchen and cutting boards using 1 teaspoon of liquid, unscented bleach mixed into 1 quart of water.   Dispose of old food. . Eat canned and packaged food before its expiration date (the "use by" or "best before" date). . Consume refrigerated leftovers within 3 to 4 days. After that time, throw out the food. Even if the food does not smell or look spoiled, it still may be unsafe. Some bacteria, such as Listeria, can grow even on foods stored in the refrigerator if they are kept for too long. Take precautions when eating out. . At restaurants, avoid buffets and salad bars where food sits out for a long time and comes in contact with many people. Food can become contaminated when someone with a virus, often a norovirus, or another "bug" handles it. . Put any leftover food in a "to-go" container yourself, rather than having the server do it. And, refrigerate leftovers as soon as you get home. . Choose restaurants that are clean and that are willing to prepare your food as you order it cooked. MEDICATIONS:  Compazine/Prochlorperazine 10mg  tablet. Take 1 tablet every 6 hours as needed for nausea/vomiting. (This can make you sleepy)   EMLA cream. Apply a quarter size amount to port site 1 hour prior to chemo. Do not rub in. Cover with plastic  wrap.   Over-the-Counter Meds:  Miralax 1 capful in 8 oz of fluid daily. May increase to two times a day if needed. This is a stool softener. If this doesn't work proceed you can add:  Senokot S-start with 1 tablet two times a day and increase to 4 tablets two times a day if needed. (Total of 8 tablets in a 24 hour period). This is a stimulant laxative.   Call us if this does not help your bowels move.   Imodium 2mg  capsule. Take 2 capsules after the 1st loose stool and then 1 capsule every 2 hours until you go a total of 12 hours without having a loose stool. Call the [facility] if loose stools continue. If diarrhea occurs @ bedtime, take 2 capsules @ bedtime. Then take 2 capsules every 4 hours until morning. Call [facility].     Diarrhea Sheet   If you are having loose stools/diarrhea, please purchase Imodium and begin taking as outlined:  At the first sign of poorly formed or loose stools you should begin taking Imodium (loperamide) 2 mg capsules.  Take two caplets (4mg ) followed by one caplet (2mg ) every 2 hours until you have had no diarrhea for 12 hours.  During the night take two caplets (4mg ) at bedtime and continue every 4 hours during the night until the morning.  Stop taking Imodium only after there is no sign of diarrhea for 12 hours.    Always call the [facility] if you are having loose stools/diarrhea that you can't get under control.  Loose stools/diarrhea leads to dehydration (loss of water) in your body.  We have other options of trying to get the loose stools/diarrhea to stop but you must let us know!    Constipation Sheet *Miralax in 8 oz. of fluid daily.  May increase to two times a day if needed.  This is a stool softener.  If this not enough to keep your bowel regular:  You can add:  *Senokot S, start with one tablet twice a day and can increase to 4 tablets twice a day if needed.  This is a stimulant laxative.   Sometimes when you take pain medication  you need BOTH a medicine to keep your stool soft and a medicine to help your bowel push it out!  Please call if the above does not work for you.   Do not go more than 2 days without a bowel movement.  It is very important that you do not become constipated.  It will make you feel sick to your stomach (nausea) and can cause abdominal pain and vomiting.    Nausea Sheet   Compazine/Prochlorperazine 10mg  tablet. Take 1 tablet every 6 hours as needed for nausea/vomiting. (This can make you sleepy)  If you are having persistent nausea (nausea that does not stop) please call the [facility] and let us know the amount of nausea that you are experiencing.  If you begin to vomit, you need to call the [facility] and if it is the weekend and you have vomited more than one time and can't get it to stop-go to the Emergency Room.  Persistent nausea/vomiting can lead to dehydration (loss of fluid in your body) and will make  you feel terrible.   Ice chips, sips of clear liquids, foods that are @ room temperature, crackers, and toast tend to be better tolerated.     SYMPTOMS TO REPORT AS SOON AS POSSIBLE AFTER TREATMENT:  FEVER GREATER THAN 100.5 F  CHILLS WITH OR WITHOUT FEVER  NAUSEA AND VOMITING THAT IS NOT CONTROLLED WITH YOUR NAUSEA MEDICATION  UNUSUAL SHORTNESS OF BREATH  UNUSUAL BRUISING OR BLEEDING  TENDERNESS IN MOUTH AND THROAT WITH OR WITHOUT PRESENCE OF ULCERS  URINARY PROBLEMS  BOWEL PROBLEMS  UNUSUAL RASH    Wear comfortable clothing and clothing appropriate for easy access to any Portacath or PICC line. Let us know if there is anything that we can do to make your therapy better!    What to do if you need assistance after hours or on the weekends: CALL [phone].  HOLD on the line, do not hang up.  You will hear multiple messages but at the end you will be connected with a nurse triage line.  They will contact the doctor if necessary.  Most of the time they will be  able to assist you.  Do not call the hospital operator.      I have been informed and understand all of the instructions given to me and have received a copy. I have been instructed to call the clinic [phone] or my family physician as soon as possible for continued medical care, if indicated. I do not have any more questions at this time but understand that I may call the [facility] or the Patient Navigator at [phone] during office hours should I have questions or need assistance in obtaining follow-up care.

## 2017-10-03 NOTE — Progress Notes (Signed)
Chemotherapy teaching pulled together.  Called patient to advise of compazine prescription and how to take.  I had to leave a message on his machine and I asked that he return my call should he have any questions.

## 2017-10-16 ENCOUNTER — Inpatient Hospital Stay (HOSPITAL_COMMUNITY): Payer: Medicare HMO | Attending: Hematology | Admitting: Hematology

## 2017-10-16 ENCOUNTER — Inpatient Hospital Stay (HOSPITAL_COMMUNITY): Payer: Medicare HMO

## 2017-10-16 ENCOUNTER — Encounter (HOSPITAL_COMMUNITY): Payer: Self-pay | Admitting: Hematology

## 2017-10-16 ENCOUNTER — Ambulatory Visit (HOSPITAL_COMMUNITY)
Admission: RE | Admit: 2017-10-16 | Discharge: 2017-10-16 | Disposition: A | Discharge status: Home / Self Care | Payer: Medicare HMO | Source: Ambulatory Visit | Attending: Nurse Practitioner | Admitting: Nurse Practitioner

## 2017-10-16 VITALS — BP 139/68 | HR 59 | Temp 97.5°F | Resp 16 | Wt 207.8 lb

## 2017-10-16 VITALS — BP 125/60 | HR 58 | Temp 95.6°F | Resp 18

## 2017-10-16 DIAGNOSIS — I4891 Unspecified atrial fibrillation: Secondary | ICD-10-CM | POA: Diagnosis not present

## 2017-10-16 DIAGNOSIS — Z5189 Encounter for other specified aftercare: Secondary | ICD-10-CM | POA: Insufficient documentation

## 2017-10-16 DIAGNOSIS — G319 Degenerative disease of nervous system, unspecified: Secondary | ICD-10-CM | POA: Diagnosis not present

## 2017-10-16 DIAGNOSIS — Z5111 Encounter for antineoplastic chemotherapy: Secondary | ICD-10-CM | POA: Insufficient documentation

## 2017-10-16 DIAGNOSIS — Z950 Presence of cardiac pacemaker: Secondary | ICD-10-CM | POA: Insufficient documentation

## 2017-10-16 DIAGNOSIS — C3492 Malignant neoplasm of unspecified part of left bronchus or lung: Secondary | ICD-10-CM

## 2017-10-16 DIAGNOSIS — C7A8 Other malignant neuroendocrine tumors: Secondary | ICD-10-CM

## 2017-10-16 DIAGNOSIS — Z87891 Personal history of nicotine dependence: Secondary | ICD-10-CM | POA: Diagnosis not present

## 2017-10-16 DIAGNOSIS — Z8041 Family history of malignant neoplasm of ovary: Secondary | ICD-10-CM | POA: Diagnosis not present

## 2017-10-16 DIAGNOSIS — Z801 Family history of malignant neoplasm of trachea, bronchus and lung: Secondary | ICD-10-CM | POA: Diagnosis not present

## 2017-10-16 DIAGNOSIS — Z8546 Personal history of malignant neoplasm of prostate: Secondary | ICD-10-CM | POA: Diagnosis not present

## 2017-10-16 DIAGNOSIS — C7B8 Other secondary neuroendocrine tumors: Secondary | ICD-10-CM | POA: Insufficient documentation

## 2017-10-16 DIAGNOSIS — Z7901 Long term (current) use of anticoagulants: Secondary | ICD-10-CM | POA: Insufficient documentation

## 2017-10-16 DIAGNOSIS — C3490 Malignant neoplasm of unspecified part of unspecified bronchus or lung: Secondary | ICD-10-CM

## 2017-10-16 LAB — CBC WITH DIFFERENTIAL/PLATELET
Basophils Absolute: 0 10*3/uL (ref 0.0–0.1)
Basophils Relative: 0 %
EOS ABS: 0.1 10*3/uL (ref 0.0–0.7)
EOS PCT: 1 %
HCT: 47.7 % (ref 39.0–52.0)
Hemoglobin: 16.5 g/dL (ref 13.0–17.0)
LYMPHS PCT: 26 %
Lymphs Abs: 2.5 10*3/uL (ref 0.7–4.0)
MCH: 31.2 pg (ref 26.0–34.0)
MCHC: 34.6 g/dL (ref 30.0–36.0)
MCV: 90.2 fL (ref 78.0–100.0)
MONO ABS: 0.7 10*3/uL (ref 0.1–1.0)
Monocytes Relative: 8 %
Neutro Abs: 6 10*3/uL (ref 1.7–7.7)
Neutrophils Relative %: 65 %
Platelets: 220 10*3/uL (ref 150–400)
RBC: 5.29 MIL/uL (ref 4.22–5.81)
RDW: 13.9 % (ref 11.5–15.5)
WBC: 9.3 10*3/uL (ref 4.0–10.5)

## 2017-10-16 LAB — COMPREHENSIVE METABOLIC PANEL
ALBUMIN: 4.2 g/dL (ref 3.5–5.0)
ALT: 20 U/L (ref 0–44)
AST: 21 U/L (ref 15–41)
Alkaline Phosphatase: 68 U/L (ref 38–126)
Anion gap: 7 (ref 5–15)
BILIRUBIN TOTAL: 1.2 mg/dL (ref 0.3–1.2)
BUN: 15 mg/dL (ref 8–23)
CHLORIDE: 104 mmol/L (ref 98–111)
CO2: 26 mmol/L (ref 22–32)
CREATININE: 1.15 mg/dL (ref 0.61–1.24)
Calcium: 9.3 mg/dL (ref 8.9–10.3)
GFR calc Af Amer: >60 mL/min (ref >60)
GFR calc non Af Amer: >60 mL/min (ref >60)
GLUCOSE: 105 mg/dL — AB (ref 70–99)
POTASSIUM: 4.3 mmol/L (ref 3.5–5.1)
Sodium: 137 mmol/L (ref 135–145)
Total Protein: 7.2 g/dL (ref 6.5–8.1)

## 2017-10-16 LAB — POCT I-STAT CREATININE: CREATININE: 1.2 mg/dL (ref 0.61–1.24)

## 2017-10-16 MED ORDER — SODIUM CHLORIDE 0.9 % IV SOLN
Freq: Once | INTRAVENOUS | Status: AC
  Administered 2017-10-16: 10:00:00 via INTRAVENOUS

## 2017-10-16 MED ORDER — SODIUM CHLORIDE 0.9 % IV SOLN
Freq: Once | INTRAVENOUS | Status: AC
  Administered 2017-10-16: 12:00:00 via INTRAVENOUS
  Filled 2017-10-16: qty 5

## 2017-10-16 MED ORDER — PALONOSETRON HCL INJECTION 0.25 MG/5ML
0.2500 mg | Freq: Once | INTRAVENOUS | Status: AC
  Administered 2017-10-16: 0.25 mg via INTRAVENOUS

## 2017-10-16 MED ORDER — SODIUM CHLORIDE 0.9 % IV SOLN: 10.0000 mg | Freq: Once | INTRAVENOUS | Status: DC

## 2017-10-16 MED ORDER — IOHEXOL 300 MG/ML  SOLN
75.0000 mL | Freq: Once | INTRAMUSCULAR | Status: AC | PRN
  Administered 2017-10-16: 75 mL via INTRAVENOUS

## 2017-10-16 MED ORDER — CARBOPLATIN CHEMO INJECTION 600 MG/60ML
497.0000 mg | Freq: Once | INTRAVENOUS | Status: AC
  Administered 2017-10-16: 500 mg via INTRAVENOUS
  Filled 2017-10-16: qty 50

## 2017-10-16 MED ORDER — SODIUM CHLORIDE 0.9% FLUSH
3.0000 mL | INTRAVENOUS | Status: DC | PRN
  Administered 2017-10-16: 3 mL
  Filled 2017-10-16: qty 10

## 2017-10-16 MED ORDER — SODIUM CHLORIDE 0.9 % IV SOLN
90.0000 mg/m2 | Freq: Once | INTRAVENOUS | Status: AC
  Administered 2017-10-16: 200 mg via INTRAVENOUS
  Filled 2017-10-16: qty 10

## 2017-10-16 NOTE — Patient Instructions (Signed)
[place] at [facility]  Discharge Instructions:  Today you received Carboplatin and Etoposide infusions. Follow up as scheduled. Call clinic for any questions or concerns.  _______________________________________________________________  Thank you for choosing [place] at [facility] to provide your oncology and hematology care.  To afford each patient quality time with our providers, please arrive at least 15 minutes before your scheduled appointment.  You need to re-schedule your appointment if you arrive 10 or more minutes late.  We strive to give you quality time with our providers, and arriving late affects you and other patients whose appointments are after yours.  Also, if you no show three or more times for appointments you may be dismissed from the clinic.  Again, thank you for choosing [place] at [facility] hope is that these requests will allow you access to exceptional care and in a timely manner. _______________________________________________________________  If you have questions after your visit, please contact our office at [phone] between the hours of 8:30 a.m. and 5:00 p.m. Voicemails left after 4:30 p.m. will not be returned until the following business day. _______________________________________________________________  For prescription refill requests, have your pharmacy contact our office. _______________________________________________________________  Recommendations made by the consultant and any test results will be sent to your referring physician. _______________________________________________________________

## 2017-10-16 NOTE — Progress Notes (Signed)
[place] reviewed with Dr. Delton Coombes and patient approved for chemotherapy treatment today.

## 2017-10-16 NOTE — Patient Instructions (Signed)
[facility] at [facility] Discharge Instructions  You saw Dr. Katragadda today.   Thank you for choosing [facility] at [facility] to provide your oncology and hematology care.  To afford each patient quality time with our provider, please arrive at least 15 minutes before your scheduled appointment time.   If you have a lab appointment with the Cancer Center please come in thru the  Main Entrance and check in at the main information desk  You need to re-schedule your appointment should you arrive 10 or more minutes late.  We strive to give you quality time with our providers, and arriving late affects you and other patients whose appointments are after yours.  Also, if you no show three or more times for appointments you may be dismissed from the clinic at the providers discretion.     Again, thank you for choosing [facility].  Our hope is that these requests will decrease the amount of time that you wait before being seen by our physicians.       _____________________________________________________________  Should you have questions after your visit to [facility], please contact our office at [phone] between the hours of 8:00 a.m. and 4:30 p.m.  Voicemails left after 4:00 p.m. will not be returned until the following business day.  For prescription refill requests, have your pharmacy contact our office and allow 72 hours.    Cancer Center Support Programs:   > Cancer Support Group  2nd Tuesday of the month 1pm-2pm, Journey Room    

## 2017-10-16 NOTE — Progress Notes (Signed)
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  CURRENT THERAPY: Carboplatin and etoposide every 3 weeks x4 cycles.  BRIEF ONCOLOGIC HISTORY:    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Douglas Baxter)   07/27/2017 PET scan    Left lower lobe lung mass with SUV of 14.6, probable metastatic left hilar lymph node, no evidence of distant metastasis    08/17/2017 Surgery    VATS left upper lobe lobectomy, mediastinal lymphadenectomy, pathology consistent with 2.6 cm x 2.1 x 2 cm large cell neuroendocrine carcinoma, station 5, station 6, station 11 lymph nodes not involved, 2/9 peribronchial lymph nodes positive for large cell neuroendocrine carcinoma, negative visceral pleural involvement    10/03/2017 -  Chemotherapy    The patient had palonosetron (ALOXI) injection 0.25 mg, 0.25 mg, Intravenous,  Once, 1 of 4 cycles Administration: 0.25 mg (10/16/2017) pegfilgrastim (NEULASTA ONPRO KIT) injection 6 mg, 6 mg, Subcutaneous, Once, 1 of 4 cycles CARBOplatin (PARAPLATIN) 500 mg in sodium chloride 0.9 % 250 mL chemo infusion, 500 mg (100 % of original dose 497 mg), Intravenous,  Once, 1 of 4 cycles Dose modification:   (original dose 497 mg, Cycle 1) Administration: 500 mg (10/16/2017) etoposide (VEPESID) 200 mg in sodium chloride 0.9 % 500 mL chemo infusion, 90 mg/m2 = 220 mg, Intravenous,  Once, 1 of 4 cycles Administration: 200 mg (10/16/2017) fosaprepitant (EMEND) 150 mg, dexamethasone (DECADRON) 12 mg in sodium chloride 0.9 % 145 mL IVPB, , Intravenous,  Once, 1 of 4 cycles Administration:  (10/16/2017)  for chemotherapy treatment.       CANCER STAGING: Cancer Staging No matching staging information was found for the patient.   INTERVAL HISTORY:  Douglas Baxter 77  y.o. male returns for follow-up of CT scan and initiate his first cycle of chemotherapy.  He denies any tingling or numbness next remedies.  He denies any headaches, vision changes.  He denies any nausea, vomiting, diarrhea or constipation.  He delayed his adjuvant chemotherapy as he went on vacation.  Energy levels are described at 85% and appetite at 90%.    REVIEW OF SYSTEMS:  Review of Systems  Constitutional: Positive for fatigue.  All other systems reviewed and are negative.    PAST MEDICAL/SURGICAL HISTORY:  Past Medical History:  Diagnosis Date  . Arthritis   . Cancer [facility])    prostate  . Hematuria   . Nocturia    Past Surgical History:  Procedure Laterality Date  . [place]   lower back  . CHOLECYSTECTOMY  2000  . LYMPHADENECTOMY Bilateral 06/24/2013   Procedure: LYMPHADENECTOMY;  Surgeon: [name], MD;  Location: [facility] ORS;  Service: Urology;  Laterality: Bilateral;  . MENISCUS REPAIR Right jan 2014  . PROSTATE BIOPSY  Apr 26, 2013  . ROBOT ASSISTED LAPAROSCOPIC RADICAL PROSTATECTOMY N/A 06/24/2013   Procedure: ROBOTIC ASSISTED LAPAROSCOPIC RADICAL PROSTATECTOMY LEVEL 2;  Surgeon: [name], MD;  Location: [facility] ORS;  Service: Urology;  Laterality: N/A;     SOCIAL HISTORY:  Social History   Socioeconomic History  . Marital status: Married    Spouse name: Not on file  . Number of children: Not on file  . Years of education: Not on file  . Highest education level: Not on file  Occupational History  .  Not on file  Social Needs  . Financial resource strain: Not on file  . Food insecurity:    Worry: Not on file    Inability: Not on file  . Transportation needs:    Medical: Not on file    Non-medical: Not on file  Tobacco Use  . Smoking status: Former Smoker    Packs/day: 1.00    Types: Cigarettes    Last attempt to quit: 03/07/1978    Years since quitting: 39.6  . Smokeless tobacco: Never Used  Substance and Sexual Activity  . Alcohol use: Yes     Comment:  occasional  . Drug use: No  . Sexual activity: Not on file  Lifestyle  . Physical activity:    Days per week: Not on file    Minutes per session: Not on file  . Stress: Not on file  Relationships  . Social connections:    Talks on phone: Not on file    Gets together: Not on file    Attends religious service: Not on file    Active member of club or organization: Not on file    Attends meetings of clubs or organizations: Not on file    Relationship status: Not on file  . Intimate partner violence:    Fear of current or ex partner: Not on file    Emotionally abused: Not on file    Physically abused: Not on file    Forced sexual activity: Not on file  Other Topics Concern  . Not on file  Social History Narrative  . Not on file    FAMILY HISTORY:  Family History  Problem Relation Age of Onset  . Deep vein thrombosis Mother   . Heart failure Father   . Ovarian cancer Sister   . Lung cancer Brother     CURRENT MEDICATIONS:  Outpatient Encounter Medications as of 10/16/2017  Medication Sig Note  . apixaban (ELIQUIS) 5 MG TABS tablet Take 5 mg by mouth 2 (two) times daily.   Marland Kitchen CARBOPLATIN IV Inject into the vein every 21 ( twenty-one) days.   . ciprofloxacin (CIPRO) 500 MG tablet Take 1 tablet (500 mg total) by mouth 2 (two) times daily. Start day prior to office visit for foley removal   . ETOPOSIDE IV Inject into the vein every 21 ( twenty-one) days.   . fexofenadine (ALLEGRA) 180 MG tablet Take 180 mg by mouth daily. 06/17/2013: Stopped for procedure   . HYDROcodone-acetaminophen (NORCO) 5-325 MG per tablet Take 1-2 tablets by mouth every 6 (six) hours as needed.   . hydroxypropyl methylcellulose (ISOPTO TEARS) 2.5 % ophthalmic solution Place 1 drop into both eyes 3 (three) times daily as needed for dry eyes.   Marland Kitchen prochlorperazine (COMPAZINE) 10 MG tablet Take 1 tablet (10 mg total) by mouth every 6 (six) hours as needed (Nausea or vomiting).    No  facility-administered encounter medications on file as of 10/16/2017.     ALLERGIES:  Allergies  Allergen Reactions  . Sulfa Antibiotics Nausea And Vomiting     PHYSICAL EXAM:  ECOG Performance status: 1  Vitals:   10/16/17 0852  BP: 139/68  Pulse: (!) 59  Resp: 16  Temp: (!) 97.5 F (36.4 C)  SpO2: 98%   Filed Weights   10/16/17 0852  Weight: 207 lb 12.8 oz (94.3 kg)    Physical Exam   LABORATORY DATA:  I have reviewed the labs as listed.  CBC    Component Value Date/Time  WBC 9.3 10/16/2017 0954   RBC 5.29 10/16/2017 0954   HGB 16.5 10/16/2017 0954   HCT 47.7 10/16/2017 0954   PLT 220 10/16/2017 0954   MCV 90.2 10/16/2017 0954   MCH 31.2 10/16/2017 0954   MCHC 34.6 10/16/2017 0954   RDW 13.9 10/16/2017 0954   LYMPHSABS 2.5 10/16/2017 0954   MONOABS 0.7 10/16/2017 0954   EOSABS 0.1 10/16/2017 0954   BASOSABS 0.0 10/16/2017 0954   CMP Latest Ref Rng & Units 10/16/2017 10/16/2017 06/17/2013  Glucose 70 - 99 mg/dL 105(H) - 77  BUN 8 - 23 mg/dL 15 - 17  Creatinine 0.61 - 1.24 mg/dL 1.15 1.20 1.16  Sodium 135 - 145 mmol/L 137 - 140  Potassium 3.5 - 5.1 mmol/L 4.3 - 4.7  Chloride 98 - 111 mmol/L 104 - 102  CO2 22 - 32 mmol/L 26 - 26  Calcium 8.9 - 10.3 mg/dL 9.3 - 9.8  Total Protein 6.5 - 8.1 g/dL 7.2 - -  Total Bilirubin 0.3 - 1.2 mg/dL 1.2 - -  Alkaline Phos 38 - 126 U/L 68 - -  AST 15 - 41 U/L 21 - -  ALT 0 - 44 U/L 20 - -       DIAGNOSTIC IMAGING:  I have reviewed the results of the CT scan of the brain and discussed with the patient and his wife.     ASSESSMENT & PLAN:   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HCC) 1.  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 -PET CT scan on 07/27/2017 shows left lung mass with SUV of 14.6, probable metastatic left hilar lymph node, no other metastatic disease. - On 08/17/2017, underwent VATS left upper  lobectomy, mediastinal lymphadenectomy. - Pathology shows 2.6 x 2.1 x 2 cm large cell neuroendocrine carcinoma, 2/9 peribronchial lymph nodes positive, station 5, 6, 11 lymph nodes negative, negative visceral pleural involvement - I have discussed extensively with the patient and his wife about how his new diagnosis.  Because of the worse prognosis compared with other forms of non-small cell lung cancer, I have recommended adjuvant chemotherapy that may offer the best chance to improve survival.  Usually these cancers are treated similar to small cell lung cancer.  Hence I have recommended combination of platinum and etoposide for 4 cycles.  He has mild degree of chronic renal insufficiency and a compromised hearing.  Hence I do not think he is a candidate for cisplatin.  We will recommend carboplatin AUC 5-6 on day 1 and etoposide 100 mg/m (day 1-3) every 21 days with Neulasta support for 4 cycles.  We talked about side effects in detail. - We discussed the results of the CT of the brain dated 10/16/2017 which did not show any evidence of metastasis. - I have reviewed blood work from today which was within normal limits.  He will proceed with his cycle 1 of chemotherapy today.  We talked about the side effects in detail again.  He will also receive Neulasta on pro device on day 3.  I will see him back in 3 weeks for follow-up.  2.  Family history: - Patient has personal history of prostate cancer which was resected in 05/01/2013.  Patient's brother died of lung cancer and was a smoker.  Sister died of ovarian cancer.  Hence I have recommended geneticist evaluation for BRCA and other mutation testing.  We will arrange it at next visit.  3.  Atrial fibrillation: -He has a pacemaker.  He is also continuing Eliquis twice daily.      Orders placed this encounter:  Orders Placed This Encounter  Procedures  . CBC with Differential/Platelet  . Comprehensive metabolic panel      Derek Jack,  MD [place] [phone]

## 2017-10-16 NOTE — Progress Notes (Signed)
Douglas Baxter tolerated chemo tx well without complaints or incident. Chemotherapy education reviewed with both the pt and his wife by Jene Every, Nurse Navigator. Peripheral IV site checked by 2 RN's prior to and after each chemotherapy medication given with positive blood return noted. IV site saline locked and wrapped with gauze for use tomorrow. VSS upon discharge. Pt discharged self ambulatory in satisfactory condition accompanied by his wife

## 2017-10-16 NOTE — Assessment & Plan Note (Signed)
1.  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 -PET CT scan on 07/27/2017 shows left lung mass with SUV of 14.6, probable metastatic left hilar lymph node, no other metastatic disease. - On 08/17/2017, underwent VATS left upper lobectomy, mediastinal lymphadenectomy. - Pathology shows 2.6 x 2.1 x 2 cm large cell neuroendocrine carcinoma, 2/9 peribronchial lymph nodes positive, station 5, 6, 11 lymph nodes negative, negative visceral pleural involvement - I have discussed extensively with the patient and his wife about how his new diagnosis.  Because of the worse prognosis compared with other forms of non-small cell lung cancer, I have recommended adjuvant chemotherapy that may offer the best chance to improve survival.  Usually these cancers are treated similar to small cell lung cancer.  Hence I have recommended combination of platinum and etoposide for 4 cycles.  He has mild degree of chronic renal insufficiency and a compromised hearing.  Hence I do not think he is a candidate for cisplatin.  We will recommend carboplatin AUC 5-6 on day 1 and etoposide 100 mg/m (day 1-3) every 21 days with Neulasta support for 4 cycles.  We talked about side effects in detail. - We discussed the results of the CT of the brain dated 10/16/2017 which did not show any evidence of metastasis. - I have reviewed blood work from today which was within normal limits.  He will proceed with his cycle 1 of chemotherapy today.  We talked about the side effects in detail again.  He will also receive Neulasta on pro device on day 3.  I will see him back in 3 weeks for follow-up.  2.  Family history: - Patient has personal history of prostate cancer which was resected in 06-May-2013.  Patient's brother died of lung cancer and was a smoker.  Sister died of ovarian cancer.  Hence I have recommended geneticist evaluation for BRCA and other  mutation testing.  We will arrange it at next visit.  3.  Atrial fibrillation: -He has a pacemaker.  He is also continuing Eliquis twice daily.

## 2017-10-17 ENCOUNTER — Inpatient Hospital Stay (HOSPITAL_COMMUNITY): Payer: Medicare HMO

## 2017-10-17 VITALS — BP 111/56 | HR 59 | Temp 98.1°F | Resp 18

## 2017-10-17 DIAGNOSIS — C7A8 Other malignant neuroendocrine tumors: Secondary | ICD-10-CM

## 2017-10-17 DIAGNOSIS — Z5111 Encounter for antineoplastic chemotherapy: Secondary | ICD-10-CM | POA: Diagnosis not present

## 2017-10-17 MED ORDER — DEXAMETHASONE SODIUM PHOSPHATE 10 MG/ML IJ SOLN
10.0000 mg | Freq: Once | INTRAMUSCULAR | Status: AC
  Administered 2017-10-17: 10 mg via INTRAVENOUS
  Filled 2017-10-17: qty 1

## 2017-10-17 MED ORDER — SODIUM CHLORIDE 0.9 % IV SOLN: 10.0000 mg | Freq: Once | INTRAVENOUS | Status: DC

## 2017-10-17 MED ORDER — SODIUM CHLORIDE 0.9 % IV SOLN
90.0000 mg/m2 | Freq: Once | INTRAVENOUS | Status: AC
  Administered 2017-10-17: 200 mg via INTRAVENOUS
  Filled 2017-10-17: qty 10

## 2017-10-17 MED ORDER — SODIUM CHLORIDE 0.9 % IV SOLN
Freq: Once | INTRAVENOUS | Status: AC
Start: 2017-10-17 — End: 2017-10-17
  Administered 2017-10-17: 09:00:00 via INTRAVENOUS

## 2017-10-17 MED ORDER — SODIUM CHLORIDE 0.9% FLUSH
3.0000 mL | INTRAVENOUS | Status: DC | PRN
  Administered 2017-10-17: 3 mL
  Filled 2017-10-17: qty 10

## 2017-10-17 NOTE — Progress Notes (Signed)
Douglas Baxter tolerated Etoposide infusion without incident or complaint. Peripheral IV noted for positive blood return by 2 RNS prior to, during, and after infusion. VSS upon completion of treatment. IV flushed and left in place with reinforcement for use tomorrow. Pt discharged self ambulatory in satisfactory condition in presence of wife.

## 2017-10-17 NOTE — Patient Instructions (Signed)
[place] at [facility]  Discharge Instructions:  Today you received Etoposide infusion. Follow up as scheduled. Call clinic for any questions or concerns. _______________________________________________________________  Thank you for choosing [place] at [facility] to provide your oncology and hematology care.  To afford each patient quality time with our providers, please arrive at least 15 minutes before your scheduled appointment.  You need to re-schedule your appointment if you arrive 10 or more minutes late.  We strive to give you quality time with our providers, and arriving late affects you and other patients whose appointments are after yours.  Also, if you no show three or more times for appointments you may be dismissed from the clinic.  Again, thank you for choosing [place] at [place] hope is that these requests will allow you access to exceptional care and in a timely manner. _______________________________________________________________  If you have questions after your visit, please contact our office at [phone] between the hours of 8:30 a.m. and 5:00 p.m. Voicemails left after 4:30 p.m. will not be returned until the following business day. _______________________________________________________________  For prescription refill requests, have your pharmacy contact our office. _______________________________________________________________  Recommendations made by the consultant and any test results will be sent to your referring physician. _______________________________________________________________

## 2017-10-18 ENCOUNTER — Encounter (HOSPITAL_COMMUNITY): Payer: Self-pay

## 2017-10-18 ENCOUNTER — Inpatient Hospital Stay (HOSPITAL_COMMUNITY): Payer: Medicare HMO

## 2017-10-18 VITALS — BP 130/66 | HR 63 | Temp 97.4°F | Resp 18 | Wt 213.6 lb

## 2017-10-18 DIAGNOSIS — C7A8 Other malignant neuroendocrine tumors: Secondary | ICD-10-CM

## 2017-10-18 DIAGNOSIS — Z5111 Encounter for antineoplastic chemotherapy: Secondary | ICD-10-CM | POA: Diagnosis not present

## 2017-10-18 MED ORDER — PEGFILGRASTIM 6 MG/0.6ML ~~LOC~~ PSKT
6.0000 mg | PREFILLED_SYRINGE | Freq: Once | SUBCUTANEOUS | Status: AC
  Administered 2017-10-18: 6 mg via SUBCUTANEOUS
  Filled 2017-10-18: qty 0.6

## 2017-10-18 MED ORDER — CHLORPROMAZINE HCL 25 MG PO TABS
25.0000 mg | ORAL_TABLET | Freq: Once | ORAL | Status: AC
  Administered 2017-10-18: 25 mg via ORAL
  Filled 2017-10-18: qty 1

## 2017-10-18 MED ORDER — DEXAMETHASONE SODIUM PHOSPHATE 10 MG/ML IJ SOLN
10.0000 mg | Freq: Once | INTRAMUSCULAR | Status: AC
  Administered 2017-10-18: 10 mg via INTRAVENOUS

## 2017-10-18 MED ORDER — SODIUM CHLORIDE 0.9 % IV SOLN
90.0000 mg/m2 | Freq: Once | INTRAVENOUS | Status: AC
  Administered 2017-10-18: 200 mg via INTRAVENOUS
  Filled 2017-10-18: qty 10

## 2017-10-18 MED ORDER — SODIUM CHLORIDE 0.9 % IV SOLN
Freq: Once | INTRAVENOUS | Status: AC
  Administered 2017-10-18: 11:00:00 via INTRAVENOUS

## 2017-10-18 MED ORDER — SODIUM CHLORIDE 0.9% FLUSH
3.0000 mL | INTRAVENOUS | Status: DC | PRN
  Administered 2017-10-18: 3 mL
  Filled 2017-10-18: qty 10

## 2017-10-18 MED ORDER — DEXAMETHASONE SODIUM PHOSPHATE 10 MG/ML IJ SOLN
INTRAMUSCULAR | Status: AC
  Filled 2017-10-18: qty 1

## 2017-10-18 MED ORDER — SODIUM CHLORIDE 0.9 % IV SOLN: 10.0000 mg | Freq: Once | INTRAVENOUS | Status: DC

## 2017-10-18 MED ORDER — CHLORPROMAZINE HCL 25 MG PO TABS: 25.0000 mg | ORAL_TABLET | Freq: Three times a day (TID) | ORAL | 0 refills | Status: DC | PRN

## 2017-10-18 NOTE — Patient Instructions (Addendum)
[facility] Discharge Instructions for Patients Receiving Chemotherapy   Beginning January 23rd 2017 lab work for the [facility] will be done in the  Main lab at [facility] on 1st floor. If you have a lab appointment with the [place] please come in thru the  Main Entrance and check in at the main information desk   Today you received the following chemotherapy agents VP-16 as well as Neulasta on-pro. Follow-up as scheduled. Call clinic for any questions or concerns  To help prevent nausea and vomiting after your treatment, we encourage you to take your nausea medication   If you develop nausea and vomiting, or diarrhea that is not controlled by your medication, call the clinic.  The clinic phone number is [phone]. Office hours are Monday-Friday 8:30am-5:00pm.  BELOW ARE SYMPTOMS THAT SHOULD BE REPORTED IMMEDIATELY:  *FEVER GREATER THAN 101.0 F  *CHILLS WITH OR WITHOUT FEVER  NAUSEA AND VOMITING THAT IS NOT CONTROLLED WITH YOUR NAUSEA MEDICATION  *UNUSUAL SHORTNESS OF BREATH  *UNUSUAL BRUISING OR BLEEDING  TENDERNESS IN MOUTH AND THROAT WITH OR WITHOUT PRESENCE OF ULCERS  *URINARY PROBLEMS  *BOWEL PROBLEMS  UNUSUAL RASH Items with * indicate a potential emergency and should be followed up as soon as possible. If you have an emergency after office hours please contact your primary care physician or go to the nearest emergency department.  Please call the clinic during office hours if you have any questions or concerns.   You may also contact the Patient Navigator at [phone] should you have any questions or need assistance in obtaining follow up care.      Resources For Cancer Patients and their Caregivers ? American Cancer Society: Can assist with transportation, wigs, general needs, runs Look Good Feel Better.        541-606-2611 ? Cancer Care: Provides financial assistance, online support groups, medication/co-pay assistance.   1-800-813-HOPE 321-489-7404) ? [facility] Assists [place] cancer patients and their families through emotional , educational and financial support.  (701)365-3622 ? [place] DSS Where to apply for food stamps, Medicaid and utility assistance. (757)702-4816 ? [facility]: Transportation to medical appointments. 714-353-5625 ? Social Security Administration: May apply for disability if have a Stage IV cancer. 920-578-8405 (810) 266-7143 ? [employer], Disability and Transit Services: Assists with nutrition, care and transit needs. 314-413-8539

## 2017-10-18 NOTE — Progress Notes (Signed)
Asencion Gowda tolerated Etoposide infusion with Neulasta on-pro well without complaints or incident.Pt did complain of hiccups that started last night and MD made aware. Thorazine 25 mg given PO today and script e-scribed per MD order. Peripheral IV site checked by 2 RN's with good blood return noted prior to and after the infusion. VSS upon discharge.Reviewed the information on Neulasta on-pro with the pt and his wife who verbalized understanding Neulasta on-pro applied to pt's left arm with green indicator light flashing upon discharge. Pt discharged self ambulatory in satisfactory condition accompanied by his wife

## 2017-10-19 ENCOUNTER — Telehealth (HOSPITAL_COMMUNITY): Payer: Self-pay

## 2017-10-19 NOTE — Telephone Encounter (Signed)
24 hr Chemo F/U call Spoke with pt who reported that he is doing well. He does continue to have the hiccups off and on but the Thorazine is helping although it makes him very drowsy. Pt denies any fever,chills, nausea or pain and the peripheral IV site that was used to administer his chemotherapy looks good without any redness or soreness noted. Pt instructed to call for any problems or questions and he verbalized understanding

## 2017-11-07 ENCOUNTER — Inpatient Hospital Stay (HOSPITAL_COMMUNITY): Payer: Medicare HMO | Admitting: Hematology

## 2017-11-07 ENCOUNTER — Encounter (HOSPITAL_COMMUNITY): Payer: Self-pay | Admitting: Hematology

## 2017-11-07 ENCOUNTER — Inpatient Hospital Stay (HOSPITAL_COMMUNITY): Payer: Medicare HMO | Attending: Hematology

## 2017-11-07 ENCOUNTER — Inpatient Hospital Stay (HOSPITAL_COMMUNITY): Payer: Medicare HMO

## 2017-11-07 VITALS — BP 104/58 | HR 64 | Temp 97.7°F | Resp 18 | Wt 204.0 lb

## 2017-11-07 DIAGNOSIS — R0609 Other forms of dyspnea: Secondary | ICD-10-CM

## 2017-11-07 DIAGNOSIS — Z8041 Family history of malignant neoplasm of ovary: Secondary | ICD-10-CM

## 2017-11-07 DIAGNOSIS — Z7901 Long term (current) use of anticoagulants: Secondary | ICD-10-CM | POA: Insufficient documentation

## 2017-11-07 DIAGNOSIS — C7B8 Other secondary neuroendocrine tumors: Secondary | ICD-10-CM | POA: Diagnosis not present

## 2017-11-07 DIAGNOSIS — I4891 Unspecified atrial fibrillation: Secondary | ICD-10-CM

## 2017-11-07 DIAGNOSIS — Z8546 Personal history of malignant neoplasm of prostate: Secondary | ICD-10-CM | POA: Diagnosis not present

## 2017-11-07 DIAGNOSIS — C7A8 Other malignant neuroendocrine tumors: Secondary | ICD-10-CM

## 2017-11-07 DIAGNOSIS — Z950 Presence of cardiac pacemaker: Secondary | ICD-10-CM | POA: Insufficient documentation

## 2017-11-07 DIAGNOSIS — R5383 Other fatigue: Secondary | ICD-10-CM | POA: Diagnosis not present

## 2017-11-07 DIAGNOSIS — Z5111 Encounter for antineoplastic chemotherapy: Secondary | ICD-10-CM | POA: Insufficient documentation

## 2017-11-07 DIAGNOSIS — Z801 Family history of malignant neoplasm of trachea, bronchus and lung: Secondary | ICD-10-CM

## 2017-11-07 DIAGNOSIS — Z5189 Encounter for other specified aftercare: Secondary | ICD-10-CM | POA: Insufficient documentation

## 2017-11-07 DIAGNOSIS — Z87891 Personal history of nicotine dependence: Secondary | ICD-10-CM | POA: Diagnosis not present

## 2017-11-07 DIAGNOSIS — R630 Anorexia: Secondary | ICD-10-CM | POA: Diagnosis not present

## 2017-11-07 DIAGNOSIS — G4700 Insomnia, unspecified: Secondary | ICD-10-CM

## 2017-11-07 DIAGNOSIS — G479 Sleep disorder, unspecified: Secondary | ICD-10-CM

## 2017-11-07 DIAGNOSIS — C3490 Malignant neoplasm of unspecified part of unspecified bronchus or lung: Secondary | ICD-10-CM

## 2017-11-07 LAB — CBC WITH DIFFERENTIAL/PLATELET
BASOS ABS: 0 10*3/uL (ref 0.0–0.1)
BASOS PCT: 0 %
Eosinophils Absolute: 0 10*3/uL (ref 0.0–0.7)
Eosinophils Relative: 0 %
HEMATOCRIT: 44.6 % (ref 39.0–52.0)
HEMOGLOBIN: 15.3 g/dL (ref 13.0–17.0)
LYMPHS PCT: 16 %
Lymphs Abs: 2.8 10*3/uL (ref 0.7–4.0)
MCH: 30.7 pg (ref 26.0–34.0)
MCHC: 34.3 g/dL (ref 30.0–36.0)
MCV: 89.6 fL (ref 78.0–100.0)
MONO ABS: 1.7 10*3/uL — AB (ref 0.1–1.0)
Monocytes Relative: 10 %
NEUTROS ABS: 12.5 10*3/uL — AB (ref 1.7–7.7)
NEUTROS PCT: 74 %
Platelets: 235 10*3/uL (ref 150–400)
RBC: 4.98 MIL/uL (ref 4.22–5.81)
RDW: 14.3 % (ref 11.5–15.5)
WBC: 17.1 10*3/uL — AB (ref 4.0–10.5)

## 2017-11-07 LAB — COMPREHENSIVE METABOLIC PANEL
ALBUMIN: 4 g/dL (ref 3.5–5.0)
ALT: 23 U/L (ref 0–44)
AST: 22 U/L (ref 15–41)
Alkaline Phosphatase: 84 U/L (ref 38–126)
Anion gap: 7 (ref 5–15)
BILIRUBIN TOTAL: 0.9 mg/dL (ref 0.3–1.2)
BUN: 16 mg/dL (ref 8–23)
CO2: 27 mmol/L (ref 22–32)
CREATININE: 1.25 mg/dL — AB (ref 0.61–1.24)
Calcium: 9 mg/dL (ref 8.9–10.3)
Chloride: 103 mmol/L (ref 98–111)
GFR calc Af Amer: >60 mL/min (ref >60)
GFR, EST NON AFRICAN AMERICAN: 55 mL/min — AB (ref >60)
GLUCOSE: 94 mg/dL (ref 70–99)
POTASSIUM: 4.5 mmol/L (ref 3.5–5.1)
Sodium: 137 mmol/L (ref 135–145)
TOTAL PROTEIN: 7 g/dL (ref 6.5–8.1)

## 2017-11-07 MED ORDER — PALONOSETRON HCL INJECTION 0.25 MG/5ML
0.2500 mg | Freq: Once | INTRAVENOUS | Status: AC
  Administered 2017-11-07: 0.25 mg via INTRAVENOUS
  Filled 2017-11-07: qty 5

## 2017-11-07 MED ORDER — ZOLPIDEM TARTRATE 10 MG PO TABS: 10.0000 mg | ORAL_TABLET | Freq: Every evening | ORAL | 0 refills | Status: DC | PRN

## 2017-11-07 MED ORDER — SODIUM CHLORIDE 0.9 % IV SOLN
90.0000 mg/m2 | Freq: Once | INTRAVENOUS | Status: AC
  Administered 2017-11-07: 200 mg via INTRAVENOUS
  Filled 2017-11-07: qty 10

## 2017-11-07 MED ORDER — SODIUM CHLORIDE 0.9 % IV SOLN
467.5000 mg | Freq: Once | INTRAVENOUS | Status: AC
  Administered 2017-11-07: 470 mg via INTRAVENOUS
  Filled 2017-11-07: qty 47

## 2017-11-07 MED ORDER — SODIUM CHLORIDE 0.9 % IV SOLN
Freq: Once | INTRAVENOUS | Status: AC
  Administered 2017-11-07: 11:00:00 via INTRAVENOUS
  Filled 2017-11-07: qty 5

## 2017-11-07 MED ORDER — SODIUM CHLORIDE 0.9 % IV SOLN
Freq: Once | INTRAVENOUS | Status: AC
  Administered 2017-11-07: 10:00:00 via INTRAVENOUS

## 2017-11-07 NOTE — Patient Instructions (Signed)
[place] at [facility]  Discharge Instructions:  Today you received Etoposide and Carboplatin infusions. Follow up as scheduled. Call clinic for any questions or concerns.  _______________________________________________________________  Thank you for choosing [place] at [facility] to provide your oncology and hematology care.  To afford each patient quality time with our providers, please arrive at least 15 minutes before your scheduled appointment.  You need to re-schedule your appointment if you arrive 10 or more minutes late.  We strive to give you quality time with our providers, and arriving late affects you and other patients whose appointments are after yours.  Also, if you no show three or more times for appointments you may be dismissed from the clinic.  Again, thank you for choosing [place] at [place] hope is that these requests will allow you access to exceptional care and in a timely manner. _______________________________________________________________  If you have questions after your visit, please contact our office at [phone] between the hours of 8:30 a.m. and 5:00 p.m. Voicemails left after 4:30 p.m. will not be returned until the following business day. _______________________________________________________________  For prescription refill requests, have your pharmacy contact our office. _______________________________________________________________  Recommendations made by the consultant and any test results will be sent to your referring physician. _______________________________________________________________

## 2017-11-07 NOTE — Assessment & Plan Note (Signed)
1.  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 -PET CT scan on 07/27/2017 shows left lung mass with SUV of 14.6, probable metastatic left hilar lymph node, no other metastatic disease. - On 08/17/2017, underwent VATS left upper lobectomy, mediastinal lymphadenectomy. - Pathology shows 2.6 x 2.1 x 2 cm large cell neuroendocrine carcinoma, 2/9 peribronchial lymph nodes positive, station 5, 6, 11 lymph nodes negative, negative visceral pleural involvement - I have discussed extensively with the patient and his wife about how his new diagnosis.  Because of the worse prognosis compared with other forms of non-small cell lung cancer, I have recommended adjuvant chemotherapy that may offer the best chance to improve survival.  Usually these cancers are treated similar to small cell lung cancer.  Hence I have recommended combination of platinum and etoposide for 4 cycles.  He has mild degree of chronic renal insufficiency and a compromised hearing.  Hence I do not think he is a candidate for cisplatin.  We will recommend carboplatin AUC 5-6 on day 1 and etoposide 100 mg/m (day 1-3) every 21 days with Neulasta support for 4 cycles.  We talked about side effects in detail. - We discussed the results of the CT of the brain dated 10/16/2017 which did not show any evidence of metastasis. - Cycle 1 of carboplatin and VP-16 with Neulasta on pro was given on April 2019.  He had on and off tiredness as his predominant side effect.  He also complained of trouble sleeping.  He lost about 5 to 10 pounds since last visit.  I have suggested him to increase Ensure/boost to 1 can every day. -He will proceed with cycle 2 without any dose modifications at this time.  I will see him back in 3 weeks for follow-up prior to next treatment.  2.  Family history: - Patient has personal history of prostate cancer which was resected in  2013/05/09.  Patient's brother died of lung cancer and was a smoker.  Sister died of ovarian cancer.  Hence I have recommended geneticist evaluation for BRCA and other mutation testing.  We will arrange it at next visit.  3.  Atrial fibrillation: -He has a pacemaker.  He is also continuing Eliquis twice daily.  4.  Insomnia: -He complains of trouble sleeping.  He tried melatonin did not work. -I will send a prescription for Ambien 10 mg to be taken as needed.

## 2017-11-07 NOTE — Patient Instructions (Signed)
[place] at [facility] Discharge Instructions  Follow up in 3 weeks with labs.    Thank you for choosing [place] at [facility] to provide your oncology and hematology care.  To afford each patient quality time with our provider, please arrive at least 15 minutes before your scheduled appointment time.   If you have a lab appointment with the [place] please come in thru the  Main Entrance and check in at the main information desk  You need to re-schedule your appointment should you arrive 10 or more minutes late.  We strive to give you quality time with our providers, and arriving late affects you and other patients whose appointments are after yours.  Also, if you no show three or more times for appointments you may be dismissed from the clinic at the providers discretion.     Again, thank you for choosing [facility].  Our hope is that these requests will decrease the amount of time that you wait before being seen by our physicians.       _____________________________________________________________  Should you have questions after your visit to [facility], please contact our office at [phone] between the hours of 8:00 a.m. and 4:30 p.m.  Voicemails left after 4:00 p.m. will not be returned until the following business day.  For prescription refill requests, have your pharmacy contact our office and allow 72 hours.    Cancer Center Support Programs:   > Cancer Support Group  2nd Tuesday of the month 1pm-2pm, Journey Room

## 2017-11-07 NOTE — Progress Notes (Signed)
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  CURRENT THERAPY: Carboplatin and etoposide every 3 weeks for 4 cycles   BRIEF ONCOLOGIC HISTORY:    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[place])   07/27/2017 PET scan    Left lower lobe lung mass with SUV of 14.6, probable metastatic left hilar lymph node, no evidence of distant metastasis    08/17/2017 Surgery    VATS left upper lobe lobectomy, mediastinal lymphadenectomy, pathology consistent with 2.6 cm x 2.1 x 2 cm large cell neuroendocrine carcinoma, station 5, station 6, station 11 lymph nodes not involved, 2/9 peribronchial lymph nodes positive for large cell neuroendocrine carcinoma, negative visceral pleural involvement    10/03/2017 -  Chemotherapy    The patient had palonosetron (ALOXI) injection 0.25 mg, 0.25 mg, Intravenous,  Once, 2 of 4 cycles Administration: 0.25 mg (10/16/2017) pegfilgrastim (NEULASTA ONPRO KIT) injection 6 mg, 6 mg, Subcutaneous, Once, 2 of 4 cycles Administration: 6 mg (10/18/2017) CARBOplatin (PARAPLATIN) 500 mg in sodium chloride 0.9 % 250 mL chemo infusion, 500 mg (100 % of original dose 497 mg), Intravenous,  Once, 2 of 4 cycles Dose modification:   (original dose 497 mg, Cycle 1),   (original dose 467.5 mg, Cycle 2) Administration: 500 mg (10/16/2017) etoposide (VEPESID) 200 mg in sodium chloride 0.9 % 500 mL chemo infusion, 90 mg/m2 = 220 mg, Intravenous,  Once, 2 of 4 cycles Administration: 200 mg (10/16/2017), 200 mg (10/17/2017), 200 mg (10/18/2017) fosaprepitant (EMEND) 150 mg, dexamethasone (DECADRON) 12 mg in sodium chloride 0.9 % 145 mL IVPB, , Intravenous,  Once, 2 of 4 cycles Administration:  (10/16/2017)  for chemotherapy treatment.       INTERVAL HISTORY:   Douglas Baxter 77 y.o. male returns for routine follow-up for large cell neuroendocrine carcinoma and consideration for next cycle of chemotherapy. Patient is here today with his wife and is tolerating treatmetn well. He did report fatigue and decreased in appetite. He had good days and bad days. He tries to eat 3 meals a day and drink ensure daily. His weight did decrease since his last visit. Patient energy levels are 75%. He reports SOB with exertion. Patient denies any nausea, vomiting, or diarrhea. Denies any new pains. Denies any fevers or recent infections. Denies any mouth sores or skin rashes.      REVIEW OF SYSTEMS:  Review of Systems  Constitutional: Positive for fatigue.  Respiratory: Positive for shortness of breath (with exertion).   Hematological: Bruises/bleeds easily.  Psychiatric/Behavioral: Positive for sleep disturbance.  All other systems reviewed and are negative.    PAST MEDICAL/SURGICAL HISTORY:  Past Medical History:  Diagnosis Date  . Arthritis   . Cancer [facility])    prostate  . Hematuria   . Nocturia    Past Surgical History:  Procedure Laterality Date  . [facility]   lower back  . CHOLECYSTECTOMY  2000  . LYMPHADENECTOMY Bilateral 06/24/2013   Procedure: LYMPHADENECTOMY;  Surgeon: [name], MD;  Location: [facility] ORS;  Service: Urology;  Laterality: Bilateral;  . MENISCUS REPAIR Right jan 2014  . PROSTATE BIOPSY  Apr 26, 2013  . ROBOT ASSISTED LAPAROSCOPIC RADICAL PROSTATECTOMY N/A 06/24/2013   Procedure: ROBOTIC ASSISTED LAPAROSCOPIC RADICAL PROSTATECTOMY LEVEL 2;  Surgeon: [name], MD;  Location: [facility] ORS;  Service: Urology;  Laterality: N/A;     SOCIAL HISTORY:  Social History   Socioeconomic History  . Marital status: Married    Spouse name: Not on file  . Number of children: Not on file  . Years of education: Not on file  . Highest education level: Not on file  Occupational History  . Not on file  Social Needs  . Financial resource  strain: Not on file  . Food insecurity:    Worry: Not on file    Inability: Not on file  . Transportation needs:    Medical: Not on file    Non-medical: Not on file  Tobacco Use  . Smoking status: Former Smoker    Packs/day: 1.00    Types: Cigarettes    Last attempt to quit: 03/07/1978    Years since quitting: 39.6  . Smokeless tobacco: Never Used  Substance and Sexual Activity  . Alcohol use: Yes    Comment:  occasional  . Drug use: No  . Sexual activity: Not on file  Lifestyle  . Physical activity:    Days per week: Not on file    Minutes per session: Not on file  . Stress: Not on file  Relationships  . Social connections:    Talks on phone: Not on file    Gets together: Not on file    Attends religious service: Not on file    Active member of club or organization: Not on file    Attends meetings of clubs or organizations: Not on file    Relationship status: Not on file  . Intimate partner violence:    Fear of current or ex partner: Not on file    Emotionally abused: Not on file    Physically abused: Not on file    Forced sexual activity: Not on file  Other Topics Concern  . Not on file  Social History Narrative  . Not on file    FAMILY HISTORY:  Family History  Problem Relation Age of Onset  . Deep vein thrombosis Mother   . Heart failure Father   . Ovarian cancer Sister   . Lung cancer Brother     CURRENT MEDICATIONS:  Outpatient Encounter Medications as of 11/07/2017  Medication Sig Note  . apixaban (ELIQUIS) 5 MG TABS tablet Take 5 mg by mouth 2 (two) times daily.   Marland Kitchen CARBOPLATIN IV Inject into the vein every 21 ( twenty-one) days.   . chlorproMAZINE (THORAZINE) 25 MG tablet Take 1 tablet (25 mg total) by mouth every 8 (eight) hours as needed.   . ciprofloxacin (CIPRO) 500 MG tablet Take 1 tablet (500 mg total) by mouth 2 (two) times daily. Start day prior to office visit for foley removal   . ETOPOSIDE IV Inject into the vein every 21 ( twenty-one) days.    . fexofenadine (ALLEGRA) 180 MG tablet Take 180 mg by mouth daily. 06/17/2013: Stopped for procedure   . HYDROcodone-acetaminophen (NORCO) 5-325 MG per tablet Take 1-2 tablets by mouth every 6 (six) hours as needed.   . hydroxypropyl methylcellulose (ISOPTO TEARS) 2.5 % ophthalmic solution Place 1 drop into both eyes 3 (three) times daily as needed for dry eyes.   Marland Kitchen prochlorperazine (COMPAZINE) 10 MG tablet Take 1 tablet (10 mg total) by mouth every 6 (six) hours as needed (Nausea or vomiting).   . zolpidem (AMBIEN) 10 MG tablet Take 1 tablet (10 mg total) by mouth at bedtime as needed for sleep.  No facility-administered encounter medications on file as of 11/07/2017.     ALLERGIES:  Allergies  Allergen Reactions  . Sulfa Antibiotics Nausea And Vomiting     PHYSICAL EXAM:  ECOG Performance status: 1  VITAL SIGNS: BP 126/75, P:92, R: 18, TEMP:97.3, SATS: 96% Weight: 204  Physical Exam  Constitutional: He is oriented to person, place, and time. He appears well-developed and well-nourished.  Cardiovascular: Normal rate, regular rhythm and normal heart sounds.  Pulmonary/Chest: Effort normal and breath sounds normal.  Neurological: He is alert and oriented to person, place, and time.  Skin: Skin is warm and dry.  Psychiatric: He has a normal mood and affect. His behavior is normal. Judgment and thought content normal.     LABORATORY DATA:  I have reviewed the labs as listed.  CBC    Component Value Date/Time   WBC 17.1 (H) 11/07/2017 0824   RBC 4.98 11/07/2017 0824   HGB 15.3 11/07/2017 0824   HCT 44.6 11/07/2017 0824   PLT 235 11/07/2017 0824   MCV 89.6 11/07/2017 0824   MCH 30.7 11/07/2017 0824   MCHC 34.3 11/07/2017 0824   RDW 14.3 11/07/2017 0824   LYMPHSABS 2.8 11/07/2017 0824   MONOABS 1.7 (H) 11/07/2017 0824   EOSABS 0.0 11/07/2017 0824   BASOSABS 0.0 11/07/2017 0824   CMP Latest Ref Rng & Units 11/07/2017 10/16/2017 10/16/2017  Glucose 70 - 99 mg/dL 94 105(H) -    BUN 8 - 23 mg/dL 16 15 -  Creatinine 0.61 - 1.24 mg/dL 1.25(H) 1.15 1.20  Sodium 135 - 145 mmol/L 137 137 -  Potassium 3.5 - 5.1 mmol/L 4.5 4.3 -  Chloride 98 - 111 mmol/L 103 104 -  CO2 22 - 32 mmol/L 27 26 -  Calcium 8.9 - 10.3 mg/dL 9.0 9.3 -  Total Protein 6.5 - 8.1 g/dL 7.0 7.2 -  Total Bilirubin 0.3 - 1.2 mg/dL 0.9 1.2 -  Alkaline Phos 38 - 126 U/L 84 68 -  AST 15 - 41 U/L 22 21 -  ALT 0 - 44 U/L 23 20 -         ASSESSMENT & PLAN:   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HCC) 1.  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 -PET CT scan on 07/27/2017 shows left lung mass with SUV of 14.6, probable metastatic left hilar lymph node, no other metastatic disease. - On 08/17/2017, underwent VATS left upper lobectomy, mediastinal lymphadenectomy. - Pathology shows 2.6 x 2.1 x 2 cm large cell neuroendocrine carcinoma, 2/9 peribronchial lymph nodes positive, station 5, 6, 11 lymph nodes negative, negative visceral pleural involvement - I have discussed extensively with the patient and his wife about how his new diagnosis.  Because of the worse prognosis compared with other forms of non-small cell lung cancer, I have recommended adjuvant chemotherapy that may offer the best chance to improve survival.  Usually these cancers are treated similar to small cell lung cancer.  Hence I have recommended combination of platinum and etoposide for 4 cycles.  He has mild degree of chronic renal insufficiency and a compromised hearing.  Hence I do not think he is a candidate for cisplatin.  We will recommend carboplatin AUC 5-6 on day 1 and etoposide 100 mg/m (day 1-3) every 21 days with Neulasta support for 4 cycles.  We talked about side effects in detail. - We discussed the results of the CT of  the brain dated 10/16/2017 which did not show any evidence of metastasis. - Cycle 1 of carboplatin and  VP-16 with Neulasta on pro was given on April 2019.  He had on and off tiredness as his predominant side effect.  He also complained of trouble sleeping.  He lost about 5 to 10 pounds since last visit.  I have suggested him to increase Ensure/boost to 1 can every day. -He will proceed with cycle 2 without any dose modifications at this time.  I will see him back in 3 weeks for follow-up prior to next treatment.  2.  Family history: - Patient has personal history of prostate cancer which was resected in 05/10/13.  Patient's brother died of lung cancer and was a smoker.  Sister died of ovarian cancer.  Hence I have recommended geneticist evaluation for BRCA and other mutation testing.  We will arrange it at next visit.  3.  Atrial fibrillation: -He has a pacemaker.  He is also continuing Eliquis twice daily.  4.  Insomnia: -He complains of trouble sleeping.  He tried melatonin did not work. -I will send a prescription for Ambien 10 mg to be taken as needed.      Orders placed this encounter:  Orders Placed This Encounter  Procedures  . CBC with Differential/Platelet  . Comprehensive metabolic panel      Derek Jack, MD [place] [phone]

## 2017-11-07 NOTE — Progress Notes (Signed)
Douglas Baxter toelrated chemo tx well without complaints or incident. Peripheral IV checked with positive blood return prior to and after infusion. IV site saline locked and wrapped with gauze for use tomorrow. VSS upon discharge. Pt discharged self ambulatory in satisfactory condition accompanied by his wife

## 2017-11-07 NOTE — Progress Notes (Signed)
5631 labs reviewed and patient seen by Dr. Delton Coombes who approved patient for chemo treatment today. Dr. Delton Coombes also made aware of patients recent weight loss of 9lb. Ok to proceed with treatment.

## 2017-11-08 ENCOUNTER — Inpatient Hospital Stay (HOSPITAL_COMMUNITY): Payer: Medicare HMO

## 2017-11-08 VITALS — BP 126/57 | HR 72 | Temp 97.3°F | Resp 16

## 2017-11-08 DIAGNOSIS — Z5111 Encounter for antineoplastic chemotherapy: Secondary | ICD-10-CM | POA: Diagnosis not present

## 2017-11-08 DIAGNOSIS — C7A8 Other malignant neuroendocrine tumors: Secondary | ICD-10-CM

## 2017-11-08 MED ORDER — SODIUM CHLORIDE 0.9 % IV SOLN
10.0000 mg | Freq: Once | INTRAVENOUS | Status: AC
  Administered 2017-11-08: 10 mg via INTRAVENOUS
  Filled 2017-11-08: qty 1

## 2017-11-08 MED ORDER — SODIUM CHLORIDE 0.9 % IV SOLN
Freq: Once | INTRAVENOUS | Status: AC
  Administered 2017-11-08: 10:00:00 via INTRAVENOUS

## 2017-11-08 MED ORDER — SODIUM CHLORIDE 0.9 % IV SOLN
90.0000 mg/m2 | Freq: Once | INTRAVENOUS | Status: AC
  Administered 2017-11-08: 200 mg via INTRAVENOUS
  Filled 2017-11-08: qty 10

## 2017-11-08 MED ORDER — SODIUM CHLORIDE 0.9% FLUSH
3.0000 mL | INTRAVENOUS | Status: DC | PRN
  Administered 2017-11-08: 3 mL
  Filled 2017-11-08: qty 10

## 2017-11-08 NOTE — Progress Notes (Signed)
1015 labs reviewed and peripheral IV flushed and noted for positive blood return prior to treatment. Asencion Gowda tolerated Etoposide infusion without incident or complaint. Peripheral IV noted for positive blood return prior to, during, and after infusion by 2 RNs. IV flushed and dressing reinforced with Kerlex for use tomorrow. VSS upon completion of treatment. Discharged self ambulatory in satisfactory condition in presence of wife.

## 2017-11-08 NOTE — Patient Instructions (Signed)
[place] at [facility]  Discharge Instructions:  Today you received Etoposide. Follow up as scheduled. Call clinic for any questions or concerns. _______________________________________________________________  Thank you for choosing [place] at [facility] to provide your oncology and hematology care.  To afford each patient quality time with our providers, please arrive at least 15 minutes before your scheduled appointment.  You need to re-schedule your appointment if you arrive 10 or more minutes late.  We strive to give you quality time with our providers, and arriving late affects you and other patients whose appointments are after yours.  Also, if you no show three or more times for appointments you may be dismissed from the clinic.  Again, thank you for choosing [place] at [place] hope is that these requests will allow you access to exceptional care and in a timely manner. _______________________________________________________________  If you have questions after your visit, please contact our office at [phone] between the hours of 8:30 a.m. and 5:00 p.m. Voicemails left after 4:30 p.m. will not be returned until the following business day. _______________________________________________________________  For prescription refill requests, have your pharmacy contact our office. _______________________________________________________________  Recommendations made by the consultant and any test results will be sent to your referring physician. _______________________________________________________________

## 2017-11-09 ENCOUNTER — Inpatient Hospital Stay (HOSPITAL_COMMUNITY): Payer: Medicare HMO

## 2017-11-09 VITALS — BP 132/68 | HR 59 | Temp 97.4°F | Resp 18

## 2017-11-09 DIAGNOSIS — C7A8 Other malignant neuroendocrine tumors: Secondary | ICD-10-CM

## 2017-11-09 DIAGNOSIS — Z5111 Encounter for antineoplastic chemotherapy: Secondary | ICD-10-CM | POA: Diagnosis not present

## 2017-11-09 MED ORDER — PEGFILGRASTIM 6 MG/0.6ML ~~LOC~~ PSKT
6.0000 mg | PREFILLED_SYRINGE | Freq: Once | SUBCUTANEOUS | Status: AC
  Administered 2017-11-09: 6 mg via SUBCUTANEOUS

## 2017-11-09 MED ORDER — SODIUM CHLORIDE 0.9 % IV SOLN
90.0000 mg/m2 | Freq: Once | INTRAVENOUS | Status: AC
  Administered 2017-11-09: 200 mg via INTRAVENOUS
  Filled 2017-11-09: qty 10

## 2017-11-09 MED ORDER — SODIUM CHLORIDE 0.9 % IV SOLN
Freq: Once | INTRAVENOUS | Status: AC
  Administered 2017-11-09: 11:00:00 via INTRAVENOUS

## 2017-11-09 MED ORDER — PEGFILGRASTIM 6 MG/0.6ML ~~LOC~~ PSKT
PREFILLED_SYRINGE | SUBCUTANEOUS | Status: AC
  Filled 2017-11-09: qty 0.6

## 2017-11-09 MED ORDER — SODIUM CHLORIDE 0.9% FLUSH
3.0000 mL | INTRAVENOUS | Status: DC | PRN
  Administered 2017-11-09: 3 mL
  Filled 2017-11-09: qty 10

## 2017-11-09 MED ORDER — SODIUM CHLORIDE 0.9 % IV SOLN
10.0000 mg | Freq: Once | INTRAVENOUS | Status: AC
  Administered 2017-11-09: 10 mg via INTRAVENOUS
  Filled 2017-11-09: qty 1

## 2017-11-09 NOTE — Patient Instructions (Signed)
[place] at [facility]  Discharge Instructions:  Today you received Etoposide infusion. Follow up as scheduled. Call clinic for any questions or concerns. _______________________________________________________________  Thank you for choosing [place] at [facility] to provide your oncology and hematology care.  To afford each patient quality time with our providers, please arrive at least 15 minutes before your scheduled appointment.  You need to re-schedule your appointment if you arrive 10 or more minutes late.  We strive to give you quality time with our providers, and arriving late affects you and other patients whose appointments are after yours.  Also, if you no show three or more times for appointments you may be dismissed from the clinic.  Again, thank you for choosing [place] at [place] hope is that these requests will allow you access to exceptional care and in a timely manner. _______________________________________________________________  If you have questions after your visit, please contact our office at [phone] between the hours of 8:30 a.m. and 5:00 p.m. Voicemails left after 4:30 p.m. will not be returned until the following business day. _______________________________________________________________  For prescription refill requests, have your pharmacy contact our office. _______________________________________________________________  Recommendations made by the consultant and any test results will be sent to your referring physician. _______________________________________________________________

## 2017-11-09 NOTE — Progress Notes (Signed)
Douglas Baxter tolerated day 3 of Etoposide without incident or complaint. Peripheral IV noted for blood return by 2 RNs before, during, and after infusion completed. VSS upon completion of treatment. Neulasta on-pro applied to left arm with green light blinking prior to discharge. Discharged self ambulatory in satisfactory condition in presence of wife.

## 2017-11-28 ENCOUNTER — Other Ambulatory Visit: Payer: Self-pay

## 2017-11-28 ENCOUNTER — Inpatient Hospital Stay (HOSPITAL_COMMUNITY): Payer: Medicare HMO | Admitting: Internal Medicine

## 2017-11-28 ENCOUNTER — Encounter (HOSPITAL_COMMUNITY): Payer: Self-pay | Admitting: Internal Medicine

## 2017-11-28 ENCOUNTER — Inpatient Hospital Stay (HOSPITAL_COMMUNITY): Payer: Medicare HMO

## 2017-11-28 VITALS — BP 155/55 | HR 68 | Temp 97.6°F | Resp 18

## 2017-11-28 DIAGNOSIS — Z8546 Personal history of malignant neoplasm of prostate: Secondary | ICD-10-CM

## 2017-11-28 DIAGNOSIS — Z87891 Personal history of nicotine dependence: Secondary | ICD-10-CM

## 2017-11-28 DIAGNOSIS — Z950 Presence of cardiac pacemaker: Secondary | ICD-10-CM

## 2017-11-28 DIAGNOSIS — K1379 Other lesions of oral mucosa: Secondary | ICD-10-CM

## 2017-11-28 DIAGNOSIS — I4891 Unspecified atrial fibrillation: Secondary | ICD-10-CM | POA: Diagnosis not present

## 2017-11-28 DIAGNOSIS — C7A8 Other malignant neuroendocrine tumors: Secondary | ICD-10-CM

## 2017-11-28 DIAGNOSIS — Z5111 Encounter for antineoplastic chemotherapy: Secondary | ICD-10-CM | POA: Diagnosis not present

## 2017-11-28 LAB — CBC WITH DIFFERENTIAL/PLATELET
BASOS PCT: 0 %
Basophils Absolute: 0 10*3/uL (ref 0.0–0.1)
EOS ABS: 0 10*3/uL (ref 0.0–0.7)
Eosinophils Relative: 0 %
HCT: 39 % (ref 39.0–52.0)
HEMOGLOBIN: 13.4 g/dL (ref 13.0–17.0)
LYMPHS ABS: 1.9 10*3/uL (ref 0.7–4.0)
Lymphocytes Relative: 13 %
MCH: 31 pg (ref 26.0–34.0)
MCHC: 34.4 g/dL (ref 30.0–36.0)
MCV: 90.3 fL (ref 78.0–100.0)
MONOS PCT: 7 %
Monocytes Absolute: 1 10*3/uL (ref 0.1–1.0)
NEUTROS PCT: 80 %
Neutro Abs: 11.3 10*3/uL (ref 1.7–7.7)
PLATELETS: 189 10*3/uL (ref 150–400)
RBC: 4.32 MIL/uL (ref 4.22–5.81)
RDW: 15.9 % — ABNORMAL HIGH (ref 11.5–15.5)
WBC: 14.2 10*3/uL — ABNORMAL HIGH (ref 4.0–10.5)

## 2017-11-28 LAB — COMPREHENSIVE METABOLIC PANEL
ALK PHOS: 93 U/L (ref 38–126)
ALT: 24 U/L (ref 0–44)
AST: 23 U/L (ref 15–41)
Albumin: 3.7 g/dL (ref 3.5–5.0)
Anion gap: 7 (ref 5–15)
BUN: 13 mg/dL (ref 8–23)
CHLORIDE: 106 mmol/L (ref 98–111)
CO2: 24 mmol/L (ref 22–32)
CREATININE: 1.24 mg/dL (ref 0.61–1.24)
Calcium: 9 mg/dL (ref 8.9–10.3)
GFR calc Af Amer: >60 mL/min (ref >60)
GFR, EST NON AFRICAN AMERICAN: 55 mL/min — AB (ref >60)
Glucose, Bld: 148 mg/dL — ABNORMAL HIGH (ref 70–99)
Potassium: 4.3 mmol/L (ref 3.5–5.1)
Sodium: 137 mmol/L (ref 135–145)
Total Bilirubin: 0.9 mg/dL (ref 0.3–1.2)
Total Protein: 6.5 g/dL (ref 6.5–8.1)

## 2017-11-28 MED ORDER — SODIUM CHLORIDE 0.9 % IV SOLN
90.0000 mg/m2 | Freq: Once | INTRAVENOUS | Status: AC
  Administered 2017-11-28: 200 mg via INTRAVENOUS
  Filled 2017-11-28: qty 10

## 2017-11-28 MED ORDER — PALONOSETRON HCL INJECTION 0.25 MG/5ML
0.2500 mg | Freq: Once | INTRAVENOUS | Status: AC
  Administered 2017-11-28: 0.25 mg via INTRAVENOUS

## 2017-11-28 MED ORDER — SODIUM CHLORIDE 0.9 % IV SOLN
Freq: Once | INTRAVENOUS | Status: AC
  Administered 2017-11-28: 11:00:00 via INTRAVENOUS
  Filled 2017-11-28: qty 5

## 2017-11-28 MED ORDER — PALONOSETRON HCL INJECTION 0.25 MG/5ML
INTRAVENOUS | Status: AC
  Filled 2017-11-28: qty 5

## 2017-11-28 MED ORDER — SODIUM CHLORIDE 0.9 % IV SOLN
470.0000 mg | Freq: Once | INTRAVENOUS | Status: AC
  Administered 2017-11-28: 470 mg via INTRAVENOUS
  Filled 2017-11-28: qty 47

## 2017-11-28 MED ORDER — SODIUM CHLORIDE 0.9 % IV SOLN
Freq: Once | INTRAVENOUS | Status: AC
  Administered 2017-11-28: 11:00:00 via INTRAVENOUS

## 2017-11-28 MED ORDER — MAGIC MOUTHWASH: 5.0000 mL | Freq: Four times a day (QID) | ORAL | 0 refills | Status: AC

## 2017-11-28 NOTE — Progress Notes (Signed)
Tolerated infusions w/o adverse reaction.  Alert, in no distress.  VSS.  Discharged ambulatory in c/o spouse.

## 2017-11-28 NOTE — Progress Notes (Signed)
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  Assessment and Plan:  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HCC) 1.  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 -PET CT scan on 07/27/2017 shows left lung mass with SUV of 14.6, probable metastatic left hilar lymph node, no other metastatic disease. - On 08/17/2017, underwent VATS left upper lobectomy, mediastinal lymphadenectomy. - Pathology showed 2.6 x 2.1 x 2 cm large cell neuroendocrine carcinoma, 2/9 peribronchial lymph nodes positive, station 5, 6, 11 lymph nodes negative, negative visceral pleural involvement - Pt is followed by Dr. Worthy Keeler and because of the worse prognosis compared with other forms of non-small cell lung cancer, he was recommended adjuvant chemotherapy with platinum and etoposide for 4 cycles.  He is recommended for carboplatin AUC 5-6 on day 1 and etoposide 100 mg/m (day 1-3) every 21 days with Neulasta support for 4 cycles.   - CT of the brain dated 10/16/2017 which did not show any evidence of metastasis.  Pt is seen today for follow-up prior to C3.  Labs done 11/28/2017 reviewed and showed WBC 14 HB 13.4 plts 189,000.  Chemistries WNL with K+ 4.3 Cr 1.24 and normal LFTs.  He will proceed with chemotherapy.  Pt will follow-up with  Dr. Worthy Keeler in 3 weeks prior to C4 of Carboplatin and VP-16.    2.  Mouth  pain.  He is given prescription of magic mouthwash.  Notify office if no improvement with treatment.   3.  Hiccups.  Continue thorazine as recommended.    4.  Hot spells. Pt reports this primarily affects his head.  He had prior CT brain done 10/2017 that was negative for mets.  He should notify the office if symptoms worsen.    5.  Family history: - Patient has personal history of prostate cancer which was resected in 05/07/2013.  Patient's brother died of lung cancer and was a smoker.  Sister died of ovarian cancer. Pt was recommended geneticist evaluation for BRCA and other mutation testing.    6.  Atrial fibrillation: -He has a pacemaker.  He is also continuing Eliquis twice daily.  30 minutes spent with more than 50% in counseling and coordination of care.     Interval History: Historical data obtained from note dated 11/07/2017:  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HCC) 1.  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 -PET CT scan on 07/27/2017 shows left lung mass with SUV of 14.6, probable metastatic left hilar lymph node, no other metastatic disease. - On 08/17/2017, underwent VATS left upper lobectomy, mediastinal lymphadenectomy. - Pathology showed 2.6 x 2.1 x 2 cm large cell neuroendocrine carcinoma, 2/9 peribronchial lymph nodes positive, station 5, 6, 11 lymph nodes negative, negative visceral pleural involvement - Pt is followed by Dr. Worthy Keeler and because of the worse prognosis compared with other forms of non-small cell lung cancer, he was recommended adjuvant chemotherapy with platinum  and etoposide for 4 cycles.  He is recommended for carboplatin AUC 5-6 on day 1 and etoposide 100 mg/m (day 1-3) every 21 days with Neulasta support for 4 cycles.   - CT of the brain dated 10/16/2017 which did not show any evidence of metastasis.  Current Status:  Pt is seen today for follow-up  prior to C3 of Carboplatin and VP-16.  He reports occasional hot spells and mouth pain.      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[place])   07/27/2017 PET scan    Left lower lobe lung mass with SUV of 14.6, probable metastatic left hilar lymph node, no evidence of distant metastasis    08/17/2017 Surgery    VATS left upper lobe lobectomy, mediastinal lymphadenectomy, pathology consistent with 2.6 cm x 2.1 x 2 cm large cell neuroendocrine carcinoma, station 5, station 6, station 11 lymph nodes not involved, 2/9 peribronchial lymph nodes positive for large cell neuroendocrine carcinoma, negative visceral pleural involvement    10/03/2017 -  Chemotherapy    The patient had palonosetron (ALOXI) injection 0.25 mg, 0.25 mg, Intravenous,  Once, 3 of 4 cycles Administration: 0.25 mg (10/16/2017), 0.25 mg (11/07/2017), 0.25 mg (11/28/2017) pegfilgrastim (NEULASTA ONPRO KIT) injection 6 mg, 6 mg, Subcutaneous, Once, 3 of 4 cycles Administration: 6 mg (10/18/2017), 6 mg (11/09/2017) CARBOplatin (PARAPLATIN) 500 mg in sodium chloride 0.9 % 250 mL chemo infusion, 500 mg (100 % of original dose 497 mg), Intravenous,  Once, 3 of 4 cycles Dose modification:   (original dose 497 mg, Cycle 1),   (original dose 467.5 mg, Cycle 2),   (original dose 470 mg, Cycle 3) Administration: 500 mg (10/16/2017), 470 mg (11/07/2017), 470 mg (11/28/2017) etoposide (VEPESID) 200 mg in sodium chloride 0.9 % 500 mL chemo infusion, 90 mg/m2 = 220 mg, Intravenous,  Once, 3 of 4 cycles Administration: 200 mg (10/16/2017), 200 mg (10/17/2017), 200 mg (10/18/2017), 200 mg (11/07/2017), 200 mg (11/08/2017), 200 mg (11/09/2017), 200 mg (11/28/2017) fosaprepitant (EMEND) 150 mg, dexamethasone (DECADRON) 12 mg in sodium chloride 0.9 % 145 mL IVPB, , Intravenous,  Once, 3 of 4 cycles Administration:  (10/16/2017),  (11/07/2017),  (11/28/2017)  for chemotherapy treatment.       Problem List Patient Active Problem List   Diagnosis Date Noted  . Large cell  neuroendocrine carcinoma ([place]) [C7A.8] 09/19/2017  . Goals of care, counseling/discussion [Z71.89] 09/19/2017  . Prostate cancer ([place]) [C61] 06/24/2013    Past Medical History Past Medical History:  Diagnosis Date  . Arthritis   . Cancer [facility])    prostate  . Hematuria   . Nocturia     Past Surgical History Past Surgical History:  Procedure Laterality Date  . [place]   lower back  . CHOLECYSTECTOMY  2000  . LYMPHADENECTOMY Bilateral 06/24/2013   Procedure: LYMPHADENECTOMY;  Surgeon: [name], MD;  Location: [facility] ORS;  Service: Urology;  Laterality: Bilateral;  . MENISCUS REPAIR Right jan 2014  . PROSTATE BIOPSY  Apr 26, 2013  . ROBOT ASSISTED LAPAROSCOPIC RADICAL PROSTATECTOMY N/A 06/24/2013   Procedure: ROBOTIC ASSISTED LAPAROSCOPIC RADICAL PROSTATECTOMY LEVEL 2;  Surgeon: [name], MD;  Location: [facility]S;  Service: Urology;  Laterality: N/A;    Family History Family History  Problem Relation Age of Onset  . Deep vein thrombosis Mother   . Heart failure Father   . Ovarian cancer Sister   . Lung cancer Brother      Social History  reports that he quit smoking about 23  years ago. His smoking use included cigarettes. He smoked 1.00 pack per day. He has never used smokeless tobacco. He reports that he drinks alcohol. He reports that he does not use drugs.  Medications  Current Outpatient Medications:  .  apixaban (ELIQUIS) 5 MG TABS tablet, Take 5 mg by mouth 2 (two) times daily., Disp: , Rfl:  .  CARBOPLATIN IV, Inject into the vein every 21 ( twenty-one) days., Disp: , Rfl:  .  chlorproMAZINE (THORAZINE) 25 MG tablet, Take 1 tablet (25 mg total) by mouth every 8 (eight) hours as needed., Disp: 30 tablet, Rfl: 0 .  ETOPOSIDE IV, Inject into the vein every 21 ( twenty-one) days., Disp: , Rfl:  .  fexofenadine (ALLEGRA) 180 MG tablet, Take 180 mg by mouth daily., Disp: , Rfl:  .  HYDROcodone-acetaminophen (NORCO) 5-325 MG per tablet, Take 1-2 tablets by mouth  every 6 (six) hours as needed., Disp: 30 tablet, Rfl: 0 .  hydroxypropyl methylcellulose (ISOPTO TEARS) 2.5 % ophthalmic solution, Place 1 drop into both eyes 3 (three) times daily as needed for dry eyes., Disp: , Rfl:  .  metoprolol tartrate (LOPRESSOR) 25 MG tablet, Take 25 mg by mouth 2 (two) times daily with a meal., Disp: , Rfl: 2 .  prochlorperazine (COMPAZINE) 10 MG tablet, Take 1 tablet (10 mg total) by mouth every 6 (six) hours as needed (Nausea or vomiting)., Disp: 30 tablet, Rfl: 1 .  zolpidem (AMBIEN) 10 MG tablet, Take 1 tablet (10 mg total) by mouth at bedtime as needed for sleep., Disp: 30 tablet, Rfl: 0 .  magic mouthwash SOLN, Take 5 mLs by mouth 4 (four) times daily., Disp: 240 mL, Rfl: 0  Allergies Sulfa antibiotics  Review of Systems Review of Systems - Oncology ROS mouth pain, hot spells.     Physical Exam  Vitals Wt Readings from Last 3 Encounters:  11/07/17 204 lb (92.5 kg)  10/18/17 213 lb 9.6 oz (96.9 kg)  10/16/17 207 lb 12.8 oz (94.3 kg)   Temp Readings from Last 3 Encounters:  11/28/17 97.6 F (36.4 C) (Oral)  11/09/17 (!) 97.4 F (36.3 C) (Oral)  11/08/17 (!) 97.3 F (36.3 C) (Oral)   BP Readings from Last 3 Encounters:  11/28/17 (!) 155/55  11/09/17 132/68  11/08/17 (!) 126/57   Pulse Readings from Last 3 Encounters:  11/28/17 68  11/09/17 (!) 59  11/08/17 72    Constitutional: Well-developed, well-nourished, and in no distress.   HENT: Head: Normocephalic and atraumatic.  Mouth/Throat: No oropharyngeal exudate. Mucosa moist. Eyes: Pupils are equal, round, and reactive to light. Conjunctivae are normal. No scleral icterus.  Neck: Normal range of motion. Neck supple. No JVD present.  Cardiovascular: Normal rate, regular rhythm and normal heart sounds.  Exam reveals no gallop and no friction rub.   No murmur heard. Pulmonary/Chest: Effort normal and breath sounds normal. No respiratory distress. No wheezes.No rales.  Abdominal: Soft. Bowel  sounds are normal. No distension. There is no tenderness. There is no guarding.  Musculoskeletal: No edema or tenderness.  Lymphadenopathy: No cervical, axillary or supraclavicular adenopathy.  Neurological: Alert and oriented to person, place, and time. No cranial nerve deficit.  Skin: Skin is warm and dry. No rash noted. No erythema. No pallor.  Psychiatric: Affect and judgment normal.   Labs Appointment on 11/28/2017  Component Date Value Ref Range Status  . WBC 11/28/2017 14.2* 4.0 - 10.5 K/uL Final  . RBC 11/28/2017 4.32  4.22 - 5.81 MIL/uL Final  .  Hemoglobin 11/28/2017 13.4  13.0 - 17.0 g/dL Final  . HCT 11/28/2017 39.0  39.0 - 52.0 % Final  . MCV 11/28/2017 90.3  78.0 - 100.0 fL Final  . MCH 11/28/2017 31.0  26.0 - 34.0 pg Final  . MCHC 11/28/2017 34.4  30.0 - 36.0 g/dL Final  . RDW 11/28/2017 15.9* 11.5 - 15.5 % Final  . Platelets 11/28/2017 189  150 - 400 K/uL Final  . Neutrophils Relative % 11/28/2017 80  % Final  . Neutro Abs 11/28/2017 11.3  1.7 - 7.7 K/uL Final  . Lymphocytes Relative 11/28/2017 13  % Final  . Lymphs Abs 11/28/2017 1.9  0.7 - 4.0 K/uL Final  . Monocytes Relative 11/28/2017 7  % Final  . Monocytes Absolute 11/28/2017 1.0  0.1 - 1.0 K/uL Final  . Eosinophils Relative 11/28/2017 0  % Final  . Eosinophils Absolute 11/28/2017 0.0  0.0 - 0.7 K/uL Final  . Basophils Relative 11/28/2017 0  % Final  . Basophils Absolute 11/28/2017 0.0  0.0 - 0.1 K/uL Final  . WBC Morphology 11/28/2017 ATYPICAL LYMPHOCYTES   Final   Performed at [facility], [address] [place], [place] [address]  . Sodium 11/28/2017 137  135 - 145 mmol/L Final  . Potassium 11/28/2017 4.3  3.5 - 5.1 mmol/L Final  . Chloride 11/28/2017 106  98 - 111 mmol/L Final  . CO2 11/28/2017 24  22 - 32 mmol/L Final  . Glucose, Bld 11/28/2017 148* 70 - 99 mg/dL Final  . BUN 11/28/2017 13  8 - 23 mg/dL Final  . Creatinine, Ser 11/28/2017 1.24  0.61 - 1.24 mg/dL Final  . Calcium 11/28/2017 9.0  8.9 -  10.3 mg/dL Final  . Total Protein 11/28/2017 6.5  6.5 - 8.1 g/dL Final  . Albumin 11/28/2017 3.7  3.5 - 5.0 g/dL Final  . AST 11/28/2017 23  15 - 41 U/L Final  . ALT 11/28/2017 24  0 - 44 U/L Final  . Alkaline Phosphatase 11/28/2017 93  38 - 126 U/L Final  . Total Bilirubin 11/28/2017 0.9  0.3 - 1.2 mg/dL Final  . GFR calc non Af Amer 11/28/2017 55* >60 mL/min Final  . GFR calc Af Amer 11/28/2017 >60  >60 mL/min Final   Comment: (NOTE) The eGFR has been calculated using the CKD EPI equation. This calculation has not been validated in all clinical situations. eGFR's persistently <60 mL/min signify possible Chronic Kidney Disease.   Georgiann Hahn gap 11/28/2017 7  5 - 15 Final   Performed at [facility], [address], [place], [place] [address]     Pathology No orders of the defined types were placed in this encounter.      Zoila Shutter MD

## 2017-11-29 ENCOUNTER — Encounter (HOSPITAL_COMMUNITY): Payer: Self-pay

## 2017-11-29 ENCOUNTER — Inpatient Hospital Stay (HOSPITAL_COMMUNITY): Payer: Medicare HMO

## 2017-11-29 VITALS — BP 115/53 | HR 59 | Temp 98.1°F | Resp 18

## 2017-11-29 DIAGNOSIS — Z5111 Encounter for antineoplastic chemotherapy: Secondary | ICD-10-CM | POA: Diagnosis not present

## 2017-11-29 DIAGNOSIS — C7A8 Other malignant neuroendocrine tumors: Secondary | ICD-10-CM

## 2017-11-29 MED ORDER — SODIUM CHLORIDE 0.9 % IV SOLN
Freq: Once | INTRAVENOUS | Status: AC
  Administered 2017-11-29: 10:00:00 via INTRAVENOUS

## 2017-11-29 MED ORDER — SODIUM CHLORIDE 0.9% FLUSH
3.0000 mL | INTRAVENOUS | Status: DC | PRN
  Administered 2017-11-29: 3 mL
  Filled 2017-11-29: qty 10

## 2017-11-29 MED ORDER — SODIUM CHLORIDE 0.9 % IV SOLN
90.0000 mg/m2 | Freq: Once | INTRAVENOUS | Status: AC
  Administered 2017-11-29: 200 mg via INTRAVENOUS
  Filled 2017-11-29: qty 10

## 2017-11-29 MED ORDER — SODIUM CHLORIDE 0.9 % IV SOLN
10.0000 mg | Freq: Once | INTRAVENOUS | Status: AC
  Administered 2017-11-29: 10 mg via INTRAVENOUS
  Filled 2017-11-29: qty 1

## 2017-11-29 NOTE — Patient Instructions (Signed)
[facility] Discharge Instructions for Patients Receiving Chemotherapy   Beginning January 23rd 2017 lab work for the [facility] will be done in the  Main lab at [facility] on 1st floor. If you have a lab appointment with the [facility] please come in thru the  Main Entrance and check in at the main information desk   Today you received the following chemotherapy agents VP-16. Follow-up as scheduled. Call clinic for any questions or concerns  To help prevent nausea and vomiting after your treatment, we encourage you to take your nausea medication   If you develop nausea and vomiting, or diarrhea that is not controlled by your medication, call the clinic.  The clinic phone number is [phone]. Office hours are Monday-Friday 8:30am-5:00pm.  BELOW ARE SYMPTOMS THAT SHOULD BE REPORTED IMMEDIATELY:  *FEVER GREATER THAN 101.0 F  *CHILLS WITH OR WITHOUT FEVER  NAUSEA AND VOMITING THAT IS NOT CONTROLLED WITH YOUR NAUSEA MEDICATION  *UNUSUAL SHORTNESS OF BREATH  *UNUSUAL BRUISING OR BLEEDING  TENDERNESS IN MOUTH AND THROAT WITH OR WITHOUT PRESENCE OF ULCERS  *URINARY PROBLEMS  *BOWEL PROBLEMS  UNUSUAL RASH Items with * indicate a potential emergency and should be followed up as soon as possible. If you have an emergency after office hours please contact your primary care physician or go to the nearest emergency department.  Please call the clinic during office hours if you have any questions or concerns.   You may also contact the Patient Navigator at [phone] should you have any questions or need assistance in obtaining follow up care.      Resources For Cancer Patients and their Caregivers ? American Cancer Society: Can assist with transportation, wigs, general needs, runs Look Good Feel Better.        954-329-1305 ? Cancer Care: Provides financial assistance, online support groups, medication/co-pay assistance.  1-800-813-HOPE  6301042618) ? [place] Assists [place] cancer patients and their families through emotional , educational and financial support.  (859)737-5834 ? [place] DSS Where to apply for food stamps, Medicaid and utility assistance. 206-484-3912 ? [facility]: Transportation to medical appointments. (404)452-6685 ? Social Security Administration: May apply for disability if have a Stage IV cancer. ([phone] 980-663-2234 ? [employer], Disability and Transit Services: Assists with nutrition, care and transit needs. [phone]

## 2017-11-29 NOTE — Progress Notes (Signed)
Douglas Baxter tolerated Etoposide infusion well without complaints or incident. Peripheral IV site checked by 2 RN's with positive blood return noted prior to and after infusion. VSS upon discharge. Peripheral IV with saline lock flushed and left accessed for use tomorrow. Pt discharged self ambulatory in satisfactory condition accompanied by his wife

## 2017-11-30 ENCOUNTER — Other Ambulatory Visit (HOSPITAL_COMMUNITY): Payer: Self-pay | Admitting: Nurse Practitioner

## 2017-11-30 ENCOUNTER — Inpatient Hospital Stay (HOSPITAL_COMMUNITY): Payer: Medicare HMO

## 2017-11-30 ENCOUNTER — Other Ambulatory Visit (HOSPITAL_COMMUNITY): Payer: Self-pay | Admitting: Hematology

## 2017-11-30 ENCOUNTER — Encounter (HOSPITAL_COMMUNITY): Payer: Self-pay

## 2017-11-30 VITALS — BP 141/72 | HR 62 | Temp 96.7°F | Resp 18

## 2017-11-30 DIAGNOSIS — Z5111 Encounter for antineoplastic chemotherapy: Secondary | ICD-10-CM | POA: Diagnosis not present

## 2017-11-30 DIAGNOSIS — G479 Sleep disorder, unspecified: Secondary | ICD-10-CM

## 2017-11-30 DIAGNOSIS — C7A8 Other malignant neuroendocrine tumors: Secondary | ICD-10-CM

## 2017-11-30 MED ORDER — SODIUM CHLORIDE 0.9 % IV SOLN
Freq: Once | INTRAVENOUS | Status: AC
  Administered 2017-11-30: 10:00:00 via INTRAVENOUS

## 2017-11-30 MED ORDER — PROCHLORPERAZINE MALEATE 10 MG PO TABS: 10.0000 mg | ORAL_TABLET | Freq: Four times a day (QID) | ORAL | 1 refills | Status: DC | PRN

## 2017-11-30 MED ORDER — PEGFILGRASTIM 6 MG/0.6ML ~~LOC~~ PSKT
6.0000 mg | PREFILLED_SYRINGE | Freq: Once | SUBCUTANEOUS | Status: AC
  Administered 2017-11-30: 6 mg via SUBCUTANEOUS
  Filled 2017-11-30: qty 0.6

## 2017-11-30 MED ORDER — ZOLPIDEM TARTRATE 10 MG PO TABS: 10.0000 mg | ORAL_TABLET | Freq: Every evening | ORAL | 0 refills | Status: DC | PRN

## 2017-11-30 MED ORDER — SODIUM CHLORIDE 0.9 % IV SOLN
10.0000 mg | Freq: Once | INTRAVENOUS | Status: AC
  Administered 2017-11-30: 10 mg via INTRAVENOUS
  Filled 2017-11-30: qty 1

## 2017-11-30 MED ORDER — SODIUM CHLORIDE 0.9 % IV SOLN
90.0000 mg/m2 | Freq: Once | INTRAVENOUS | Status: AC
  Administered 2017-11-30: 200 mg via INTRAVENOUS
  Filled 2017-11-30: qty 10

## 2017-11-30 MED ORDER — SODIUM CHLORIDE 0.9% FLUSH
10.0000 mL | INTRAVENOUS | Status: DC | PRN
  Administered 2017-11-30: 10 mL
  Filled 2017-11-30: qty 10

## 2017-11-30 NOTE — Progress Notes (Signed)
Right peripheral IV with good blood return but tender/sore to touch.  No swelling or bruising noted.  New access in left arm.   Patient tolerated treatment with no complaints voiced.  Good blood return noted with peripheral IV before and after administration of therapy.  No bruising or swelling noted at site.  Band aid applied.  VSS with discharge and left ambulatory with no s/s of distress noted.

## 2017-11-30 NOTE — Patient Instructions (Signed)
[place] Discharge Instructions for Patients Receiving Chemotherapy  Today you received the following chemotherapy agents etoposide.    If you develop nausea and vomiting that is not controlled by your nausea medication, call the clinic.   BELOW ARE SYMPTOMS THAT SHOULD BE REPORTED IMMEDIATELY:  *FEVER GREATER THAN 100.5 F  *CHILLS WITH OR WITHOUT FEVER  NAUSEA AND VOMITING THAT IS NOT CONTROLLED WITH YOUR NAUSEA MEDICATION  *UNUSUAL SHORTNESS OF BREATH  *UNUSUAL BRUISING OR BLEEDING  TENDERNESS IN MOUTH AND THROAT WITH OR WITHOUT PRESENCE OF ULCERS  *URINARY PROBLEMS  *BOWEL PROBLEMS  UNUSUAL RASH Items with * indicate a potential emergency and should be followed up as soon as possible.  Feel free to call the clinic should you have any questions or concerns. The clinic phone number is [phone].  Please show the [place] at check-in to the Emergency Department and triage nurse.

## 2017-12-12 ENCOUNTER — Other Ambulatory Visit (HOSPITAL_COMMUNITY): Payer: Self-pay | Admitting: Pharmacist

## 2017-12-18 ENCOUNTER — Other Ambulatory Visit: Payer: Self-pay | Admitting: Pharmacist

## 2017-12-19 ENCOUNTER — Inpatient Hospital Stay (HOSPITAL_COMMUNITY): Payer: Medicare HMO | Admitting: Hematology

## 2017-12-19 ENCOUNTER — Inpatient Hospital Stay (HOSPITAL_COMMUNITY): Payer: Medicare HMO

## 2017-12-19 ENCOUNTER — Encounter (HOSPITAL_COMMUNITY): Payer: Self-pay | Admitting: Hematology

## 2017-12-19 ENCOUNTER — Inpatient Hospital Stay (HOSPITAL_COMMUNITY): Payer: Medicare HMO | Admitting: Dietician

## 2017-12-19 ENCOUNTER — Inpatient Hospital Stay (HOSPITAL_COMMUNITY): Payer: Medicare HMO | Attending: Hematology

## 2017-12-19 ENCOUNTER — Encounter (HOSPITAL_COMMUNITY): Payer: Self-pay

## 2017-12-19 VITALS — BP 131/67 | HR 67 | Temp 96.7°F | Resp 18 | Wt 205.9 lb

## 2017-12-19 VITALS — BP 96/83 | HR 60 | Temp 97.6°F | Resp 18

## 2017-12-19 DIAGNOSIS — R531 Weakness: Secondary | ICD-10-CM

## 2017-12-19 DIAGNOSIS — Z79899 Other long term (current) drug therapy: Secondary | ICD-10-CM | POA: Insufficient documentation

## 2017-12-19 DIAGNOSIS — R7989 Other specified abnormal findings of blood chemistry: Secondary | ICD-10-CM

## 2017-12-19 DIAGNOSIS — Z87891 Personal history of nicotine dependence: Secondary | ICD-10-CM

## 2017-12-19 DIAGNOSIS — I4891 Unspecified atrial fibrillation: Secondary | ICD-10-CM

## 2017-12-19 DIAGNOSIS — Z5189 Encounter for other specified aftercare: Secondary | ICD-10-CM | POA: Diagnosis not present

## 2017-12-19 DIAGNOSIS — Z7901 Long term (current) use of anticoagulants: Secondary | ICD-10-CM | POA: Diagnosis not present

## 2017-12-19 DIAGNOSIS — C7A8 Other malignant neuroendocrine tumors: Secondary | ICD-10-CM | POA: Insufficient documentation

## 2017-12-19 DIAGNOSIS — R066 Hiccough: Secondary | ICD-10-CM

## 2017-12-19 DIAGNOSIS — Z950 Presence of cardiac pacemaker: Secondary | ICD-10-CM | POA: Diagnosis not present

## 2017-12-19 DIAGNOSIS — G4700 Insomnia, unspecified: Secondary | ICD-10-CM | POA: Insufficient documentation

## 2017-12-19 DIAGNOSIS — R5383 Other fatigue: Secondary | ICD-10-CM

## 2017-12-19 DIAGNOSIS — Z5111 Encounter for antineoplastic chemotherapy: Secondary | ICD-10-CM | POA: Diagnosis present

## 2017-12-19 LAB — COMPREHENSIVE METABOLIC PANEL
ALT: 20 U/L (ref 0–44)
AST: 22 U/L (ref 15–41)
Albumin: 4.1 g/dL (ref 3.5–5.0)
Alkaline Phosphatase: 100 U/L (ref 38–126)
Anion gap: 6 (ref 5–15)
BILIRUBIN TOTAL: 0.9 mg/dL (ref 0.3–1.2)
BUN: 14 mg/dL (ref 8–23)
CO2: 26 mmol/L (ref 22–32)
CREATININE: 1.37 mg/dL — AB (ref 0.61–1.24)
Calcium: 8.9 mg/dL (ref 8.9–10.3)
Chloride: 105 mmol/L (ref 98–111)
GFR, EST AFRICAN AMERICAN: 57 mL/min — AB (ref >60)
GFR, EST NON AFRICAN AMERICAN: 49 mL/min — AB (ref >60)
Glucose, Bld: 90 mg/dL (ref 70–99)
POTASSIUM: 4.8 mmol/L (ref 3.5–5.1)
Sodium: 137 mmol/L (ref 135–145)
TOTAL PROTEIN: 7 g/dL (ref 6.5–8.1)

## 2017-12-19 LAB — CBC WITH DIFFERENTIAL/PLATELET
Abs Immature Granulocytes: 0.49 10*3/uL — ABNORMAL HIGH (ref 0.00–0.07)
Basophils Absolute: 0.1 10*3/uL (ref 0.0–0.1)
Basophils Relative: 1 %
EOS ABS: 0 10*3/uL (ref 0.0–0.5)
EOS PCT: 0 %
HEMATOCRIT: 40.1 % (ref 39.0–52.0)
Hemoglobin: 12.8 g/dL — ABNORMAL LOW (ref 13.0–17.0)
Immature Granulocytes: 3 %
LYMPHS ABS: 2.9 10*3/uL (ref 0.7–4.0)
Lymphocytes Relative: 16 %
MCH: 30 pg (ref 26.0–34.0)
MCHC: 31.9 g/dL (ref 30.0–36.0)
MCV: 94.1 fL (ref 80.0–100.0)
MONO ABS: 1.6 10*3/uL — AB (ref 0.1–1.0)
MONOS PCT: 9 %
Neutro Abs: 12.9 10*3/uL — ABNORMAL HIGH (ref 1.7–7.7)
Neutrophils Relative %: 71 %
Platelets: 243 10*3/uL (ref 150–400)
RBC: 4.26 MIL/uL (ref 4.22–5.81)
RDW: 18.6 % — AB (ref 11.5–15.5)
WBC: 17.9 10*3/uL — ABNORMAL HIGH (ref 4.0–10.5)
nRBC: 0 % (ref 0.0–0.2)

## 2017-12-19 MED ORDER — SODIUM CHLORIDE 0.9 % IV SOLN
92.0000 mg/m2 | Freq: Once | INTRAVENOUS | Status: AC
  Administered 2017-12-19: 200 mg via INTRAVENOUS
  Filled 2017-12-19: qty 5

## 2017-12-19 MED ORDER — SODIUM CHLORIDE 0.9 % IV SOLN
INTRAVENOUS | Status: DC
  Administered 2017-12-19: 11:00:00 via INTRAVENOUS

## 2017-12-19 MED ORDER — SODIUM CHLORIDE 0.9% FLUSH
10.0000 mL | INTRAVENOUS | Status: DC | PRN
  Administered 2017-12-19: 10 mL
  Filled 2017-12-19: qty 10

## 2017-12-19 MED ORDER — SODIUM CHLORIDE 0.9 % IV SOLN
437.5000 mg | Freq: Once | INTRAVENOUS | Status: AC
  Administered 2017-12-19: 440 mg via INTRAVENOUS
  Filled 2017-12-19: qty 44

## 2017-12-19 MED ORDER — PALONOSETRON HCL INJECTION 0.25 MG/5ML
0.2500 mg | Freq: Once | INTRAVENOUS | Status: AC
  Administered 2017-12-19: 0.25 mg via INTRAVENOUS

## 2017-12-19 MED ORDER — SODIUM CHLORIDE 0.9 % IV SOLN
Freq: Once | INTRAVENOUS | Status: AC
  Administered 2017-12-19: 11:00:00 via INTRAVENOUS

## 2017-12-19 MED ORDER — CHLORPROMAZINE HCL 25 MG PO TABS: 25.0000 mg | ORAL_TABLET | Freq: Three times a day (TID) | ORAL | 0 refills | Status: DC | PRN

## 2017-12-19 MED ORDER — PALONOSETRON HCL INJECTION 0.25 MG/5ML
INTRAVENOUS | Status: AC
  Filled 2017-12-19: qty 5

## 2017-12-19 MED ORDER — SODIUM CHLORIDE 0.9 % IV SOLN
Freq: Once | INTRAVENOUS | Status: AC
  Administered 2017-12-19: 12:00:00 via INTRAVENOUS
  Filled 2017-12-19: qty 5

## 2017-12-19 NOTE — Progress Notes (Signed)
Nutrition Assessment  Reason for Assessment:  MST-wt loss and mouth pain.   ASSESSMENT:  77 y/o male PMHx Afib, pacemaker placed in May. Post-procedure xray showed lung nodule. Further evaluation showed hypermetabolic mass s/p VATS w/ L upper lobectomy and mediastinal lymphadenectomy on 08/17/2017. Pathology showed large cell neuroendocrine carcinoma. Started adjuvant chemotherapy 8/12.   Patient is on his 4th and final cycle of chemotherapy. He is being seen by RD due to report of mouth sores and weight loss.   Pt reports that mouth sores are more of a minor issue. He gets these occasionally, but they are managed well with mouth wash and these do not affect his intake.   His larger problem is a lack of appetite. He says he has no desire to eat. Went through diet recall: Breakfast: Cup of coffee, few bites of pancake, maybe oatmeal or egg Lunch: just a snack, like 6 pb crackers Dinner: Small meal, such as grilled cheese with tomato soup.  Beverages: some buttermilk, water, coffee Supplements: None constistently.   Patient denies any n/v/d. He occasionally has mild constipation, but manages this w/ dulcolax. He has fatigue.   Patient likes Boost, but cares nothing for Ensure. Though, spouse states he wont even drink one of these each day. She also notes he has developed an aversion to meats.   Weight wise, he is 206 lbs. He was 213.6 lbs 2 months ago, though that weight was more of an outlier- he was 209 in July. Patient/spouse he has only lost 3-5 lbs.   Wt Readings from Last 10 Encounters:  12/19/17 205 lb 14.4 oz (93.4 kg)  11/07/17 204 lb (92.5 kg)  10/18/17 213 lb 9.6 oz (96.9 kg)  10/16/17 207 lb 12.8 oz (94.3 kg)  09/19/17 209 lb (94.8 kg)  06/24/13 188 lb (85.3 kg)  06/17/13 188 lb (85.3 kg)   Nutrition Focused Physical Exam:  No signs of muscle or fat wasting. Appears well nourished.   Medications:  Chemo: Carboplatin and Etoposide  Other: Magic Mouthwash, Thorazine,  emend, Compazine, norco  Labs: WBC:17.9, Creat: 1.37 (8/12:1.15)  Recent Labs  Lab 12/19/17 0846  NA 137  K 4.8  CL 105  CO2 26  BUN 14  CREATININE 1.37*  CALCIUM 8.9  GLUCOSE 90   Anthropometrics:  Height: 6\' 1"  (185.42 cm) Weight: 205 lbs 14 oz (93.6 kg) BMI: 27.2  Estimated Energy Needs Kcals: 2250-2450 kcals (24-26kcal/kg bw) Protein: 100-115g Pro (1.2-1.4g/kg ibw Fluid: 2.3-2.67ml/kg bw  NUTRITION DIAGNOSIS:  Increased protein/kcal needs r/t cancer and cancer related treatments as evidenced by estimated nutrition requirements for this condition/therapy.   MALNUTRITION DIAGNOSIS:  n/a  INTERVENTION:  Though he is close to maintaining his weight, he does not sound to be getting near enough protein in diet. RD Reviewed importance of adequate protein intake during chemotherapy. We discussed sources of protein other than meat.   His beverages mostly consist of water. Given his slight weight loss, RD asked him to start consuming more of the milk, which is actually a better hydrator and will provide extra protein/kcals.   RD explained concept of never eating a food by itself. He should add toppings, condiments, dressings, creams, syrups etc to  meals. Examples given are adding sugar/cream to coffee (he is already), adding cheese to soup and adding butter/syrup to his pancakes (he already is). These items are very calorie dense and take of very up very little space in the stomach.   RD did advocate for 1 Boost supplement/day. Offered  Ensure, but he does not like these at all.   RD provided handout titled "Increasing calories and Protein". Hand wrote on top of handout to drink 1 Boost/day and to also consume 2 glasses of milk/day.   MONITORING, EVALUATION, GOAL:  Weight, reported PO intake, appetite  Next Visit:  As needed   Burtis Junes RD, LDN, CNSC Clinical Nutrition Available Tues-Sat via Pager: [phone] 12/19/2017 11:42 AM

## 2017-12-19 NOTE — Patient Instructions (Signed)
[place] at [name] Discharge Instructions  Follow up in 4 weeks with CT scan and labs   Thank you for choosing [place] at [facility] to provide your oncology and hematology care.  To afford each patient quality time with our provider, please arrive at least 15 minutes before your scheduled appointment time.   If you have a lab appointment with the [facility] please come in thru the  Main Entrance and check in at the main information desk  You need to re-schedule your appointment should you arrive 10 or more minutes late.  We strive to give you quality time with our providers, and arriving late affects you and other patients whose appointments are after yours.  Also, if you no show three or more times for appointments you may be dismissed from the clinic at the providers discretion.     Again, thank you for choosing [facility].  Our hope is that these requests will decrease the amount of time that you wait before being seen by our physicians.       _____________________________________________________________  Should you have questions after your visit to [facility], please contact our office at [phone] between the hours of 8:00 a.m. and 4:30 p.m.  Voicemails left after 4:00 p.m. will not be returned until the following business day.  For prescription refill requests, have your pharmacy contact our office and allow 72 hours.    Cancer Center Support Programs:   > Cancer Support Group  2nd Tuesday of the month 1pm-2pm, Journey Room

## 2017-12-19 NOTE — Patient Instructions (Signed)
[facility] Discharge Instructions for Patients Receiving Chemotherapy  Today you received the following chemotherapy agents etoposide and carboplatin.    If you develop nausea and vomiting that is not controlled by your nausea medication, call the clinic.   BELOW ARE SYMPTOMS THAT SHOULD BE REPORTED IMMEDIATELY:  *FEVER GREATER THAN 100.5 F  *CHILLS WITH OR WITHOUT FEVER  NAUSEA AND VOMITING THAT IS NOT CONTROLLED WITH YOUR NAUSEA MEDICATION  *UNUSUAL SHORTNESS OF BREATH  *UNUSUAL BRUISING OR BLEEDING  TENDERNESS IN MOUTH AND THROAT WITH OR WITHOUT PRESENCE OF ULCERS  *URINARY PROBLEMS  *BOWEL PROBLEMS  UNUSUAL RASH Items with * indicate a potential emergency and should be followed up as soon as possible.  Feel free to call the clinic should you have any questions or concerns. The clinic phone number is [phone].  Please show the [place] at check-in to the Emergency Department and triage nurse.

## 2017-12-19 NOTE — Progress Notes (Signed)
[facility] [place], [place] [address]   CLINIC:  Medical Oncology/Hematology  PCP:  [name], MD [id number] [address] [phone]   REASON FOR VISIT: Follow-up for large cell neuroendocrine carcinoma  CURRENT THERAPY: Carboplatin and Etoposide every 3 weeks for 4 cycles. (Today is the last cycle)  BRIEF ONCOLOGIC HISTORY:    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[place])   07/27/2017 PET scan    Left lower lobe lung mass with SUV of 14.6, probable metastatic left hilar lymph node, no evidence of distant metastasis    08/17/2017 Surgery    VATS left upper lobe lobectomy, mediastinal lymphadenectomy, pathology consistent with 2.6 cm x 2.1 x 2 cm large cell neuroendocrine carcinoma, station 5, station 6, station 11 lymph nodes not involved, 2/9 peribronchial lymph nodes positive for large cell neuroendocrine carcinoma, negative visceral pleural involvement    10/03/2017 -  Chemotherapy    The patient had palonosetron (ALOXI) injection 0.25 mg, 0.25 mg, Intravenous,  Once, 4 of 4 cycles Administration: 0.25 mg (10/16/2017), 0.25 mg (11/07/2017), 0.25 mg (11/28/2017), 0.25 mg (12/19/2017) pegfilgrastim (NEULASTA ONPRO KIT) injection 6 mg, 6 mg, Subcutaneous, Once, 3 of 3 cycles Administration: 6 mg (10/18/2017), 6 mg (11/09/2017), 6 mg (11/30/2017) pegfilgrastim-cbqv (UDENYCA) injection 6 mg, 6 mg, Subcutaneous, Once, 1 of 1 cycle CARBOplatin (PARAPLATIN) 500 mg in sodium chloride 0.9 % 250 mL chemo infusion, 500 mg (100 % of original dose 497 mg), Intravenous,  Once, 4 of 4 cycles Dose modification:   (original dose 497 mg, Cycle 1),   (original dose 467.5 mg, Cycle 2),   (original dose 470 mg, Cycle 3),   (original dose 437.5 mg, Cycle 4) Administration: 500 mg (10/16/2017), 470 mg (11/07/2017), 470 mg (11/28/2017), 440 mg (12/19/2017) etoposide (VEPESID) 200 mg in sodium chloride 0.9 % 500 mL chemo infusion, 90 mg/m2 = 220 mg, Intravenous,   Once, 4 of 4 cycles Administration: 200 mg (10/16/2017), 200 mg (10/17/2017), 200 mg (10/18/2017), 200 mg (11/07/2017), 200 mg (11/08/2017), 200 mg (11/09/2017), 200 mg (11/28/2017), 200 mg (11/29/2017), 200 mg (11/30/2017), 200 mg (12/19/2017) fosaprepitant (EMEND) 150 mg, dexamethasone (DECADRON) 12 mg in sodium chloride 0.9 % 145 mL IVPB, , Intravenous,  Once, 4 of 4 cycles Administration:  (10/16/2017),  (11/07/2017),  (11/28/2017),  (12/19/2017)  for chemotherapy treatment.       INTERVAL HISTORY:  Mr. Fill 77 y.o. male returns for routine follow-up for large cell neuroendocrine carcinoma. Patient is here today with his wife. He states he has more fatigue and weakness than last treatment. He still has the hiccups for roughly 50 hours post treatment before they resolve. He reports his appetite and energy level are at 50%. He is maintaining his weight at this time. He denies any new pains. Denies any bleeding. Denies any nausea, vomiting, or diarrhea. Denies any skin rashes or mouth sores.     REVIEW OF SYSTEMS:  Review of Systems  Constitutional: Positive for chills and fatigue.  Neurological: Positive for extremity weakness.  All other systems reviewed and are negative.    PAST MEDICAL/SURGICAL HISTORY:  Past Medical History:  Diagnosis Date  . Arthritis   . Cancer [facility])    prostate  . Hematuria   . Nocturia    Past Surgical History:  Procedure Laterality Date  . [place]   lower back  . CHOLECYSTECTOMY  2000  . LYMPHADENECTOMY Bilateral 06/24/2013   Procedure: LYMPHADENECTOMY;  Surgeon: [name], MD;  Location: [facility];  Service: Urology;  Laterality: Bilateral;  . MENISCUS REPAIR Right jan 2014  . PROSTATE BIOPSY  Apr 26, 2013  . ROBOT ASSISTED LAPAROSCOPIC RADICAL PROSTATECTOMY N/A 06/24/2013   Procedure: ROBOTIC ASSISTED LAPAROSCOPIC RADICAL PROSTATECTOMY LEVEL 2;  Surgeon: [name], MD;  Location: [facility] ORS;  Service: Urology;  Laterality: N/A;     SOCIAL HISTORY:    Social History   Socioeconomic History  . Marital status: Married    Spouse name: Not on file  . Number of children: Not on file  . Years of education: Not on file  . Highest education level: Not on file  Occupational History  . Not on file  Social Needs  . Financial resource strain: Not on file  . Food insecurity:    Worry: Not on file    Inability: Not on file  . Transportation needs:    Medical: Not on file    Non-medical: Not on file  Tobacco Use  . Smoking status: Former Smoker    Packs/day: 1.00    Types: Cigarettes    Last attempt to quit: 03/07/1978    Years since quitting: 39.8  . Smokeless tobacco: Never Used  Substance and Sexual Activity  . Alcohol use: Yes    Comment:  occasional  . Drug use: No  . Sexual activity: Not on file  Lifestyle  . Physical activity:    Days per week: Not on file    Minutes per session: Not on file  . Stress: Not on file  Relationships  . Social connections:    Talks on phone: Not on file    Gets together: Not on file    Attends religious service: Not on file    Active member of club or organization: Not on file    Attends meetings of clubs or organizations: Not on file    Relationship status: Not on file  . Intimate partner violence:    Fear of current or ex partner: Not on file    Emotionally abused: Not on file    Physically abused: Not on file    Forced sexual activity: Not on file  Other Topics Concern  . Not on file  Social History Narrative  . Not on file    FAMILY HISTORY:  Family History  Problem Relation Age of Onset  . Deep vein thrombosis Mother   . Heart failure Father   . Ovarian cancer Sister   . Lung cancer Brother     CURRENT MEDICATIONS:  Outpatient Encounter Medications as of 12/19/2017  Medication Sig Note  . apixaban (ELIQUIS) 5 MG TABS tablet Take 5 mg by mouth 2 (two) times daily.   [place] Kitchen CARBOPLATIN IV Inject into the vein every 21 ( twenty-one) days.   . ETOPOSIDE IV Inject into the vein  every 21 ( twenty-one) days.   . fexofenadine (ALLEGRA) 180 MG tablet Take 180 mg by mouth daily. 06/17/2013: Stopped for procedure   . HYDROcodone-acetaminophen (NORCO) 5-325 MG per tablet Take 1-2 tablets by mouth every 6 (six) hours as needed.   . hydroxypropyl methylcellulose (ISOPTO TEARS) 2.5 % ophthalmic solution Place 1 drop into both eyes 3 (three) times daily as needed for dry eyes.   . magic mouthwash SOLN Take 5 mLs by mouth 4 (four) times daily.   . metoprolol tartrate (LOPRESSOR) 25 MG tablet Take 25 mg by mouth 2 (two) times daily with a meal.   . prochlorperazine (COMPAZINE) 10 MG tablet Take 1 tablet (10 mg  total) by mouth every 6 (six) hours as needed (Nausea or vomiting).   . zolpidem (AMBIEN) 10 MG tablet Take 1 tablet (10 mg total) by mouth at bedtime as needed for sleep.   . [DISCONTINUED] chlorproMAZINE (THORAZINE) 25 MG tablet Take 1 tablet (25 mg total) by mouth every 8 (eight) hours as needed.    No facility-administered encounter medications on file as of 12/19/2017.     ALLERGIES:  Allergies  Allergen Reactions  . Sulfa Antibiotics Nausea And Vomiting     PHYSICAL EXAM:  ECOG Performance status: 1  Vitals:   12/19/17 0911  BP: 131/67  Pulse: 67  Resp: 18  Temp: (!) 96.7 F (35.9 C)  SpO2: 97%   Filed Weights   12/19/17 0911  Weight: 205 lb 14.4 oz (93.4 kg)    Physical Exam  Constitutional: He is oriented to person, place, and time. He appears well-developed and well-nourished.  Cardiovascular: Normal rate, regular rhythm and normal heart sounds.  Pulmonary/Chest: Effort normal and breath sounds normal.  Musculoskeletal: Normal range of motion.  Neurological: He is alert and oriented to person, place, and time.  Skin: Skin is warm and dry.  Psychiatric: He has a normal mood and affect. His behavior is normal. Judgment and thought content normal.     LABORATORY DATA:  I have reviewed the labs as listed.  CBC    Component Value Date/Time    WBC 17.9 (H) 12/19/2017 0846   RBC 4.26 12/19/2017 0846   HGB 12.8 (L) 12/19/2017 0846   HCT 40.1 12/19/2017 0846   PLT 243 12/19/2017 0846   MCV 94.1 12/19/2017 0846   MCH 30.0 12/19/2017 0846   MCHC 31.9 12/19/2017 0846   RDW 18.6 (H) 12/19/2017 0846   LYMPHSABS 2.9 12/19/2017 0846   MONOABS 1.6 (H) 12/19/2017 0846   EOSABS 0.0 12/19/2017 0846   BASOSABS 0.1 12/19/2017 0846   CMP Latest Ref Rng & Units 12/19/2017 11/28/2017 11/07/2017  Glucose 70 - 99 mg/dL 90 148(H) 94  BUN 8 - 23 mg/dL _0 Creatinine 0.61 - 1.24 mg/dL 1.37(H) 1.24 1.25(H)  Sodium 135 - 145 mmol/L 137 137 137  Potassium 3.5 - 5.1 mmol/L 4.8 4.3 4.5  Chloride 98 - 111 mmol/L 105 106 103  CO2 22 - 32 mmol/L _1 Calcium 8.9 - 10.3 mg/dL 8.9 9.0 9.0  Total Protein 6.5 - 8.1 g/dL 7.0 6.5 7.0  Total Bilirubin 0.3 - 1.2 mg/dL 0.9 0.9 0.9  Alkaline Phos 38 - 126 U/L 100 93 84  AST 15 - 41 U/L _2 ALT 0 - 44 U/L _3 ASSESSMENT & PLAN:   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HCC) 1.  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 -PET CT scan on 07/27/2017 shows left lung mass with SUV of 14.6, probable metastatic left hilar lymph node, no other metastatic disease. - On 08/17/2017, underwent VATS left upper lobectomy, mediastinal lymphadenectomy. - Pathology shows 2.6 x 2.1 x 2 cm large cell neuroendocrine carcinoma, 2/9 peribronchial lymph nodes positive, station 5, 6, 11 lymph nodes negative, negative visceral pleural involvement - I have discussed extensively with the patient and his wife about how his new diagnosis.  Because of the worse prognosis compared with other forms of non-small cell lung cancer, I have recommended adjuvant chemotherapy that may offer the  best chance to improve survival.  Usually these cancers are treated similar to small cell lung cancer.  Hence I have  recommended combination of platinum and etoposide for 4 cycles.  He has mild degree of chronic renal insufficiency and a compromised hearing.  Hence I do not think he is a candidate for cisplatin.  We will recommend carboplatin AUC 5-6 on day 1 and etoposide 100 mg/m (day 1-3) every 21 days with Neulasta support for 4 cycles.  We talked about side effects in detail. - We discussed the results of the CT of the brain dated 10/16/2017 which did not show any evidence of metastasis. - 3 cycles of chemotherapy with carboplatin and VP-16 from 10/16/2017 through 11/28/2017. - He may proceed with cycle 4 without any dose modifications. -he will use chlorpromazine 25 mg every 6 hours as needed for hiccups.  He usually gets hiccups starting day 2 evening. -he will come back in 4 weeks with a repeat CT scans.  2.  Family history: - Patient has personal history of prostate cancer which was resected in 2013-04-21.  Patient's brother died of lung cancer and was a smoker.  Sister died of ovarian cancer.  Hence I have recommended geneticist evaluation for BRCA and other mutation testing.  We will make an appointment for him to meet Roma Kayser.  3.  Atrial fibrillation: -He has a pacemaker.  He is also continuing Eliquis twice daily.  4.  Insomnia: -He is taking Ambien 10 mg half tablet 2 times at night.  It is helping him to sleep.      Orders placed this encounter:  Orders Placed This Encounter  Procedures  . CT Chest W Contrast  . CBC with Differential/Platelet  . Comprehensive metabolic panel      Derek Jack, MD [place] [phone]

## 2017-12-19 NOTE — Assessment & Plan Note (Signed)
1.  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 -PET CT scan on 07/27/2017 shows left lung mass with SUV of 14.6, probable metastatic left hilar lymph node, no other metastatic disease. - On 08/17/2017, underwent VATS left upper lobectomy, mediastinal lymphadenectomy. - Pathology shows 2.6 x 2.1 x 2 cm large cell neuroendocrine carcinoma, 2/9 peribronchial lymph nodes positive, station 5, 6, 11 lymph nodes negative, negative visceral pleural involvement - I have discussed extensively with the patient and his wife about how his new diagnosis.  Because of the worse prognosis compared with other forms of non-small cell lung cancer, I have recommended adjuvant chemotherapy that may offer the best chance to improve survival.  Usually these cancers are treated similar to small cell lung cancer.  Hence I have recommended combination of platinum and etoposide for 4 cycles.  He has mild degree of chronic renal insufficiency and a compromised hearing.  Hence I do not think he is a candidate for cisplatin.  We will recommend carboplatin AUC 5-6 on day 1 and etoposide 100 mg/m (day 1-3) every 21 days with Neulasta support for 4 cycles.  We talked about side effects in detail. - We discussed the results of the CT of the brain dated 10/16/2017 which did not show any evidence of metastasis. - 3 cycles of chemotherapy with carboplatin and VP-16 from 10/16/2017 through 11/28/2017. - He may proceed with cycle 4 without any dose modifications. -he will use chlorpromazine 25 mg every 6 hours as needed for hiccups.  He usually gets hiccups starting day 2 evening. -he will come back in 4 weeks with a repeat CT scans.  2.  Family history: - Patient has personal history of prostate cancer which was resected in 2015.  Patient's brother died of lung cancer and was a smoker.  Sister died of ovarian cancer.  Hence I have  recommended geneticist evaluation for BRCA and other mutation testing.  We will make an appointment for him to meet Karen Powell.  3.  Atrial fibrillation: -He has a pacemaker.  He is also continuing Eliquis twice daily.  4.  Insomnia: -He is taking Ambien 10 mg half tablet 2 times at night.  It is helping him to sleep. 

## 2017-12-19 NOTE — Progress Notes (Signed)
Patient tolerated treatment with no complaints voiced.  Peripheral IV site clean and dry with good blood return noted before and after chemotherapy.  No bruising or swelling noted at site.  Dressing intact and wrapped in [place] for tomorrows treatment.

## 2017-12-20 ENCOUNTER — Inpatient Hospital Stay (HOSPITAL_COMMUNITY): Payer: Medicare HMO

## 2017-12-20 ENCOUNTER — Encounter (HOSPITAL_COMMUNITY): Payer: Self-pay

## 2017-12-20 VITALS — BP 106/61 | HR 66 | Temp 97.2°F | Resp 18 | Wt 212.2 lb

## 2017-12-20 DIAGNOSIS — Z5111 Encounter for antineoplastic chemotherapy: Secondary | ICD-10-CM | POA: Diagnosis not present

## 2017-12-20 DIAGNOSIS — C7A8 Other malignant neuroendocrine tumors: Secondary | ICD-10-CM

## 2017-12-20 MED ORDER — SODIUM CHLORIDE 0.9% FLUSH
10.0000 mL | INTRAVENOUS | Status: DC | PRN
  Administered 2017-12-20: 10 mL
  Filled 2017-12-20: qty 10

## 2017-12-20 MED ORDER — SODIUM CHLORIDE 0.9 % IV SOLN
Freq: Once | INTRAVENOUS | Status: AC
  Administered 2017-12-20: 10:00:00 via INTRAVENOUS

## 2017-12-20 MED ORDER — SODIUM CHLORIDE 0.9 % IV SOLN
10.0000 mg | Freq: Once | INTRAVENOUS | Status: AC
  Administered 2017-12-20: 10 mg via INTRAVENOUS
  Filled 2017-12-20: qty 1

## 2017-12-20 MED ORDER — SODIUM CHLORIDE 0.9 % IV SOLN
92.0000 mg/m2 | Freq: Once | INTRAVENOUS | Status: AC
  Administered 2017-12-20: 200 mg via INTRAVENOUS
  Filled 2017-12-20: qty 10

## 2017-12-20 NOTE — Patient Instructions (Signed)
[facility] Discharge Instructions for Patients Receiving Chemotherapy  Today you received the following chemotherapy agents etoposide.    If you develop nausea and vomiting that is not controlled by your nausea medication, call the clinic.   BELOW ARE SYMPTOMS THAT SHOULD BE REPORTED IMMEDIATELY:  *FEVER GREATER THAN 100.5 F  *CHILLS WITH OR WITHOUT FEVER  NAUSEA AND VOMITING THAT IS NOT CONTROLLED WITH YOUR NAUSEA MEDICATION  *UNUSUAL SHORTNESS OF BREATH  *UNUSUAL BRUISING OR BLEEDING  TENDERNESS IN MOUTH AND THROAT WITH OR WITHOUT PRESENCE OF ULCERS  *URINARY PROBLEMS  *BOWEL PROBLEMS  UNUSUAL RASH Items with * indicate a potential emergency and should be followed up as soon as possible.  Feel free to call the clinic should you have any questions or concerns. The clinic phone number is [phone].  Please show the [place] at check-in to the Emergency Department and triage nurse.

## 2017-12-20 NOTE — Progress Notes (Signed)
Patient tolerated treatment with no complaints voiced.  Peripheral IV with good blood return before and after treatment.  No bruising or swelling noted at site.  Dressing intact.  VSs with discharge and left ambulatory with no s/s of distress noted.

## 2017-12-21 ENCOUNTER — Encounter (HOSPITAL_COMMUNITY): Payer: Self-pay

## 2017-12-21 ENCOUNTER — Inpatient Hospital Stay (HOSPITAL_COMMUNITY): Payer: Medicare HMO

## 2017-12-21 VITALS — BP 123/64 | HR 60 | Temp 97.3°F | Resp 18 | Wt 214.8 lb

## 2017-12-21 DIAGNOSIS — Z5111 Encounter for antineoplastic chemotherapy: Secondary | ICD-10-CM | POA: Diagnosis not present

## 2017-12-21 DIAGNOSIS — C7A8 Other malignant neuroendocrine tumors: Secondary | ICD-10-CM

## 2017-12-21 MED ORDER — SODIUM CHLORIDE 0.9 % IV SOLN
10.0000 mg | Freq: Once | INTRAVENOUS | Status: AC
  Administered 2017-12-21: 10 mg via INTRAVENOUS
  Filled 2017-12-21: qty 1

## 2017-12-21 MED ORDER — SODIUM CHLORIDE 0.9% FLUSH
3.0000 mL | INTRAVENOUS | Status: DC | PRN
  Administered 2017-12-21: 3 mL
  Filled 2017-12-21: qty 10

## 2017-12-21 MED ORDER — SODIUM CHLORIDE 0.9 % IV SOLN
92.0000 mg/m2 | Freq: Once | INTRAVENOUS | Status: AC
  Administered 2017-12-21: 200 mg via INTRAVENOUS
  Filled 2017-12-21: qty 10

## 2017-12-21 MED ORDER — SODIUM CHLORIDE 0.9 % IV SOLN
Freq: Once | INTRAVENOUS | Status: AC
  Administered 2017-12-21: 10:00:00 via INTRAVENOUS

## 2017-12-21 NOTE — Progress Notes (Signed)
Asencion Gowda tolerated VP-16 infusion well without complaints or incident. Peripheral IV site checked by 2 RN's with positive blood return noted prior to and after infusion. VSS upon discharge. Pt discharged self ambulatory in satisfactory condition accompanied by his wife

## 2017-12-21 NOTE — Patient Instructions (Addendum)
[facility] Discharge Instructions for Patients Receiving Chemotherapy   Beginning January 23rd 2017 lab work for the [facility] will be done in the  Main lab at [facility] on 1st floor. If you have a lab appointment with the [facility] please come in thru the  Main Entrance and check in at the main information desk   Today you received the following chemotherapy agents VP-16. Follow-up as scheduled. Call clinic for any questions or concerns  To help prevent nausea and vomiting after your treatment, we encourage you to take your nausea medication   If you develop nausea and vomiting, or diarrhea that is not controlled by your medication, call the clinic.  The clinic phone number is [phone]. Office hours are Monday-Friday 8:30am-5:00pm.  BELOW ARE SYMPTOMS THAT SHOULD BE REPORTED IMMEDIATELY:  *FEVER GREATER THAN 101.0 F  *CHILLS WITH OR WITHOUT FEVER  NAUSEA AND VOMITING THAT IS NOT CONTROLLED WITH YOUR NAUSEA MEDICATION  *UNUSUAL SHORTNESS OF BREATH  *UNUSUAL BRUISING OR BLEEDING  TENDERNESS IN MOUTH AND THROAT WITH OR WITHOUT PRESENCE OF ULCERS  *URINARY PROBLEMS  *BOWEL PROBLEMS  UNUSUAL RASH Items with * indicate a potential emergency and should be followed up as soon as possible. If you have an emergency after office hours please contact your primary care physician or go to the nearest emergency department.  Please call the clinic during office hours if you have any questions or concerns.   You may also contact the Patient Navigator at [phone] should you have any questions or need assistance in obtaining follow up care.      Resources For Cancer Patients and their Caregivers ? American Cancer Society: Can assist with transportation, wigs, general needs, runs Look Good Feel Better.        954-329-1305 ? Cancer Care: Provides financial assistance, online support groups, medication/co-pay assistance.  1-800-813-HOPE  6301042618) ? [place] Assists [place] cancer patients and their families through emotional , educational and financial support.  (859)737-5834 ? [place] DSS Where to apply for food stamps, Medicaid and utility assistance. 206-484-3912 ? [facility]: Transportation to medical appointments. (404)452-6685 ? Social Security Administration: May apply for disability if have a Stage IV cancer. (954)168-0066 980-663-2234 ? [employer], Disability and Transit Services: Assists with nutrition, care and transit needs. 434-798-4547

## 2017-12-22 ENCOUNTER — Encounter (HOSPITAL_COMMUNITY): Payer: Self-pay

## 2017-12-22 ENCOUNTER — Inpatient Hospital Stay (HOSPITAL_COMMUNITY): Payer: Medicare HMO

## 2017-12-22 VITALS — BP 133/65 | HR 74 | Temp 97.6°F | Resp 18

## 2017-12-22 DIAGNOSIS — C7A8 Other malignant neuroendocrine tumors: Secondary | ICD-10-CM

## 2017-12-22 DIAGNOSIS — Z5111 Encounter for antineoplastic chemotherapy: Secondary | ICD-10-CM | POA: Diagnosis not present

## 2017-12-22 MED ORDER — PEGFILGRASTIM-CBQV 6 MG/0.6ML ~~LOC~~ SOSY
PREFILLED_SYRINGE | SUBCUTANEOUS | Status: AC
  Filled 2017-12-22: qty 0.6

## 2017-12-22 MED ORDER — PEGFILGRASTIM-CBQV 6 MG/0.6ML ~~LOC~~ SOSY
6.0000 mg | PREFILLED_SYRINGE | Freq: Once | SUBCUTANEOUS | Status: AC
  Administered 2017-12-22: 6 mg via SUBCUTANEOUS

## 2017-12-22 NOTE — Progress Notes (Signed)
Douglas Baxter tolerated Udenyca injection well without complaints or incident. VSS Pt discharged self ambulatory in satisfactory condition accompanied by his wife

## 2017-12-22 NOTE — Patient Instructions (Signed)
[place] at [facility] Discharge Instructions  Received Udenyca injection today. Follow-up as scheduled. Call clinic for any questions or concerns   Thank you for choosing [place] at [facility] to provide your oncology and hematology care.  To afford each patient quality time with our provider, please arrive at least 15 minutes before your scheduled appointment time.   If you have a lab appointment with the [facility] please come in thru the  Main Entrance and check in at the main information desk  You need to re-schedule your appointment should you arrive 10 or more minutes late.  We strive to give you quality time with our providers, and arriving late affects you and other patients whose appointments are after yours.  Also, if you no show three or more times for appointments you may be dismissed from the clinic at the providers discretion.     Again, thank you for choosing [facility].  Our hope is that these requests will decrease the amount of time that you wait before being seen by our physicians.       _____________________________________________________________  Should you have questions after your visit to [facility], please contact our office at [phone] between the hours of 8:00 a.m. and 4:30 p.m.  Voicemails left after 4:00 p.m. will not be returned until the following business day.  For prescription refill requests, have your pharmacy contact our office and allow 72 hours.    Cancer Center Support Programs:   > Cancer Support Group  2nd Tuesday of the month 1pm-2pm, Journey Room

## 2017-12-26 ENCOUNTER — Telehealth (HOSPITAL_COMMUNITY): Payer: Self-pay | Admitting: Hematology

## 2018-01-03 ENCOUNTER — Other Ambulatory Visit (HOSPITAL_COMMUNITY): Payer: Self-pay | Admitting: Hematology

## 2018-01-03 DIAGNOSIS — G479 Sleep disorder, unspecified: Secondary | ICD-10-CM

## 2018-01-05 ENCOUNTER — Encounter (HOSPITAL_COMMUNITY): Payer: Self-pay | Admitting: *Deleted

## 2018-01-05 ENCOUNTER — Other Ambulatory Visit (HOSPITAL_COMMUNITY): Payer: Self-pay

## 2018-01-05 DIAGNOSIS — G479 Sleep disorder, unspecified: Secondary | ICD-10-CM

## 2018-01-05 MED ORDER — ZOLPIDEM TARTRATE 10 MG PO TABS: 10.0000 mg | ORAL_TABLET | Freq: Every evening | ORAL | 0 refills | Status: DC | PRN

## 2018-01-16 ENCOUNTER — Ambulatory Visit (HOSPITAL_COMMUNITY)
Admission: RE | Admit: 2018-01-16 | Discharge: 2018-01-16 | Disposition: A | Discharge status: Home / Self Care | Payer: Medicare HMO | Source: Ambulatory Visit | Attending: Nurse Practitioner | Admitting: Nurse Practitioner

## 2018-01-16 ENCOUNTER — Inpatient Hospital Stay (HOSPITAL_COMMUNITY): Payer: Managed Care, Other (non HMO) | Attending: Hematology

## 2018-01-16 DIAGNOSIS — J90 Pleural effusion, not elsewhere classified: Secondary | ICD-10-CM | POA: Diagnosis not present

## 2018-01-16 DIAGNOSIS — Z8546 Personal history of malignant neoplasm of prostate: Secondary | ICD-10-CM | POA: Insufficient documentation

## 2018-01-16 DIAGNOSIS — C7A8 Other malignant neuroendocrine tumors: Secondary | ICD-10-CM | POA: Insufficient documentation

## 2018-01-16 DIAGNOSIS — Z902 Acquired absence of lung [part of]: Secondary | ICD-10-CM | POA: Insufficient documentation

## 2018-01-16 DIAGNOSIS — G4700 Insomnia, unspecified: Secondary | ICD-10-CM | POA: Insufficient documentation

## 2018-01-16 DIAGNOSIS — Z950 Presence of cardiac pacemaker: Secondary | ICD-10-CM | POA: Insufficient documentation

## 2018-01-16 DIAGNOSIS — R918 Other nonspecific abnormal finding of lung field: Secondary | ICD-10-CM | POA: Insufficient documentation

## 2018-01-16 DIAGNOSIS — Z7901 Long term (current) use of anticoagulants: Secondary | ICD-10-CM | POA: Insufficient documentation

## 2018-01-16 DIAGNOSIS — I4891 Unspecified atrial fibrillation: Secondary | ICD-10-CM | POA: Insufficient documentation

## 2018-01-16 DIAGNOSIS — Z87891 Personal history of nicotine dependence: Secondary | ICD-10-CM | POA: Insufficient documentation

## 2018-01-16 MED ORDER — IOHEXOL 300 MG/ML  SOLN
75.0000 mL | Freq: Once | INTRAMUSCULAR | Status: AC | PRN
  Administered 2018-01-16: 75 mL via INTRAVENOUS

## 2018-01-19 ENCOUNTER — Other Ambulatory Visit (HOSPITAL_COMMUNITY): Payer: Medicare HMO

## 2018-01-19 ENCOUNTER — Other Ambulatory Visit: Payer: Self-pay

## 2018-01-19 ENCOUNTER — Encounter (HOSPITAL_COMMUNITY): Payer: Self-pay | Admitting: Hematology

## 2018-01-19 ENCOUNTER — Inpatient Hospital Stay (HOSPITAL_COMMUNITY): Payer: Managed Care, Other (non HMO) | Admitting: Hematology

## 2018-01-19 VITALS — BP 128/55 | HR 64 | Temp 96.0°F | Resp 20 | Wt 205.5 lb

## 2018-01-19 DIAGNOSIS — Z87891 Personal history of nicotine dependence: Secondary | ICD-10-CM | POA: Diagnosis not present

## 2018-01-19 DIAGNOSIS — R0609 Other forms of dyspnea: Secondary | ICD-10-CM | POA: Diagnosis not present

## 2018-01-19 DIAGNOSIS — Z8546 Personal history of malignant neoplasm of prostate: Secondary | ICD-10-CM

## 2018-01-19 DIAGNOSIS — I4891 Unspecified atrial fibrillation: Secondary | ICD-10-CM

## 2018-01-19 DIAGNOSIS — Z7901 Long term (current) use of anticoagulants: Secondary | ICD-10-CM | POA: Diagnosis not present

## 2018-01-19 DIAGNOSIS — C7A8 Other malignant neuroendocrine tumors: Secondary | ICD-10-CM | POA: Diagnosis not present

## 2018-01-19 DIAGNOSIS — G4700 Insomnia, unspecified: Secondary | ICD-10-CM

## 2018-01-19 DIAGNOSIS — Z950 Presence of cardiac pacemaker: Secondary | ICD-10-CM

## 2018-01-19 NOTE — Assessment & Plan Note (Addendum)
1.  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 -PET CT scan on 07/27/2017 shows left lung mass with SUV of 14.6, probable metastatic left hilar lymph node, no other metastatic disease. - On 08/17/2017, underwent VATS left upper lobectomy, mediastinal lymphadenectomy. - Pathology shows 2.6 x 2.1 x 2 cm large cell neuroendocrine carcinoma, 2/9 peribronchial lymph nodes positive, station 5, 6, 11 lymph nodes negative, negative visceral pleural involvement - CT of the brain on 10/16/2017 did not show any evidence of intracranial metastasis. - 4 cycles of adjuvant chemotherapy with carboplatin and VP-16 from 10/16/2017 through 12/19/2017.  - He is 4 weeks status post completion of adjuvant chemotherapy.  His energy levels are slowly improving. -We discussed the results of the CT scan of the chest dated 01/16/2018 shows changes of left upper lobectomy with small left pleural effusion.  I have compared it with the post surgery x-ray from [facility].  The left pleural effusion is stable.  No other evidence of malignancy. -We will plan to see him back in 4 months with repeat CT scan of the chest.  He was encouraged to start exercising to improve his fatigue.  2.  Family history: - Patient has personal history of prostate cancer which was resected in 05/13/13.  Patient's brother died of lung cancer and was a smoker.  Sister died of ovarian cancer.  Hence I have recommended geneticist evaluation for BRCA and other mutation testing.  He has an appointment next month to meet Roma Kayser.  3.  Atrial fibrillation: -He has a pacemaker.  He is continuing Eliquis twice daily without any bleeding complications.  4.  Insomnia: -He is taking Ambien 10 mg half tablet 2 times at night.  It is helping him to sleep.

## 2018-01-19 NOTE — Progress Notes (Signed)
 [facility] [address] , [place] [address]   CLINIC:  Medical Oncology/Hematology  PCP:  Stambaugh, Merris, MD [address] [place] [address] [phone]   REASON FOR VISIT: Follow-up for large cell neuroendocrine carcinoma  CURRENT THERAPY: Surveillance.  BRIEF ONCOLOGIC HISTORY:    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HCC)   07/27/2017 PET scan    Left lower lobe lung mass with SUV of 14.6, probable metastatic left hilar lymph node, no evidence of distant metastasis    08/17/2017 Surgery    VATS left upper lobe lobectomy, mediastinal lymphadenectomy, pathology consistent with 2.6 cm x 2.1 x 2 cm large cell neuroendocrine carcinoma, station 5, station 6, station 11 lymph nodes not involved, 2/9 peribronchial lymph nodes positive for large cell neuroendocrine carcinoma, negative visceral pleural involvement    10/03/2017 -  Chemotherapy    The patient had palonosetron (ALOXI) injection 0.25 mg, 0.25 mg, Intravenous,  Once, 4 of 4 cycles Administration: 0.25 mg (10/16/2017), 0.25 mg (11/07/2017), 0.25 mg (11/28/2017), 0.25 mg (12/19/2017) pegfilgrastim (NEULASTA ONPRO KIT) injection 6 mg, 6 mg, Subcutaneous, Once, 3 of 3 cycles Administration: 6 mg (10/18/2017), 6 mg (11/09/2017), 6 mg (11/30/2017) pegfilgrastim-cbqv (UDENYCA) injection 6 mg, 6 mg, Subcutaneous, Once, 1 of 1 cycle CARBOplatin (PARAPLATIN) 500 mg in sodium chloride 0.9 % 250 mL chemo infusion, 500 mg (100 % of original dose 497 mg), Intravenous,  Once, 4 of 4 cycles Dose modification:   (original dose 497 mg, Cycle 1),   (original dose 467.5 mg, Cycle 2),   (original dose 470 mg, Cycle 3),   (original dose 437.5 mg, Cycle 4) Administration: 500 mg (10/16/2017), 470 mg (11/07/2017), 470 mg (11/28/2017), 440 mg (12/19/2017) etoposide (VEPESID) 200 mg in sodium chloride 0.9 % 500 mL chemo infusion, 90 mg/m2 = 220 mg, Intravenous,  Once, 4 of 4 cycles Administration: 200 mg (10/16/2017), 200 mg  (10/17/2017), 200 mg (10/18/2017), 200 mg (11/07/2017), 200 mg (11/08/2017), 200 mg (11/09/2017), 200 mg (11/28/2017), 200 mg (11/29/2017), 200 mg (11/30/2017), 200 mg (12/19/2017), 200 mg (12/20/2017), 200 mg (12/21/2017) fosaprepitant (EMEND) 150 mg, dexamethasone (DECADRON) 12 mg in sodium chloride 0.9 % 145 mL IVPB, , Intravenous,  Once, 4 of 4 cycles Administration:  (10/16/2017),  (11/07/2017),  (11/28/2017),  (12/19/2017)  for chemotherapy treatment.       INTERVAL HISTORY:  Douglas Baxter 77 y.o. male returns for follow-up after 4 cycles of chemotherapy.  His fatigue is improving.  He complains of mild shortness of breath on exertion.  On Monday, he raked leaves for about 2 hours intermittently.  Denies any nausea vomiting or diarrhea.  Denies any fevers or infections.    REVIEW OF SYSTEMS:  Review of Systems  Constitutional: Positive for fatigue. Negative for chills.  Respiratory: Positive for shortness of breath.   Neurological: Negative for extremity weakness.  All other systems reviewed and are negative.    PAST MEDICAL/SURGICAL HISTORY:  Past Medical History:  Diagnosis Date  . Arthritis   . Cancer (HCC)    prostate  . Hematuria   . Nocturia    Past Surgical History:  Procedure Laterality Date  . BACK SURGERY  1977   lower back  . CHOLECYSTECTOMY  2000  . LYMPHADENECTOMY Bilateral 06/24/2013   Procedure: LYMPHADENECTOMY;  Surgeon: Les Borden, MD;  Location: [facility];  Service: Urology;  Laterality: Bilateral;  . MENISCUS REPAIR Right jan 2014  . PROSTATE BIOPSY  Apr 26, 2013  . ROBOT ASSISTED LAPAROSCOPIC RADICAL PROSTATECTOMY N/A 06/24/2013     Procedure: ROBOTIC ASSISTED LAPAROSCOPIC RADICAL PROSTATECTOMY LEVEL 2;  Surgeon: Les Borden, MD;  Location: [facility];  Service: Urology;  Laterality: N/A;     SOCIAL HISTORY:  Social History   Socioeconomic History  . Marital status: Married    Spouse name: Not on file  . Number of children: Not on file  . Years of education: Not on  file  . Highest education level: Not on file  Occupational History  . Not on file  Social Needs  . Financial resource strain: Not on file  . Food insecurity:    Worry: Not on file    Inability: Not on file  . Transportation needs:    Medical: Not on file    Non-medical: Not on file  Tobacco Use  . Smoking status: Former Smoker    Packs/day: 1.00    Types: Cigarettes    Last attempt to quit: 03/07/1978    Years since quitting: 39.8  . Smokeless tobacco: Never Used  Substance and Sexual Activity  . Alcohol use: Yes    Comment:  occasional  . Drug use: No  . Sexual activity: Not on file  Lifestyle  . Physical activity:    Days per week: Not on file    Minutes per session: Not on file  . Stress: Not on file  Relationships  . Social connections:    Talks on phone: Not on file    Gets together: Not on file    Attends religious service: Not on file    Active member of club or organization: Not on file    Attends meetings of clubs or organizations: Not on file    Relationship status: Not on file  . Intimate partner violence:    Fear of current or ex partner: Not on file    Emotionally abused: Not on file    Physically abused: Not on file    Forced sexual activity: Not on file  Other Topics Concern  . Not on file  Social History Narrative  . Not on file    FAMILY HISTORY:  Family History  Problem Relation Age of Onset  . Deep vein thrombosis Mother   . Heart failure Father   . Ovarian cancer Sister   . Lung cancer Brother     CURRENT MEDICATIONS:  Outpatient Encounter Medications as of 01/19/2018  Medication Sig Note  . apixaban (ELIQUIS) 5 MG TABS tablet Take 5 mg by mouth 2 (two) times daily.   . furosemide (LASIX) 20 MG tablet    . hydroxypropyl methylcellulose (ISOPTO TEARS) 2.5 % ophthalmic solution Place 1 drop into both eyes 3 (three) times daily as needed for dry eyes.   . metoprolol tartrate (LOPRESSOR) 25 MG tablet Take 25 mg by mouth 2 (two) times daily  with a meal.   . zolpidem (AMBIEN) 10 MG tablet Take 1 tablet (10 mg total) by mouth at bedtime as needed for sleep.   . CARBOPLATIN IV Inject into the vein every 21 ( twenty-one) days.   . chlorproMAZINE (THORAZINE) 25 MG tablet Take 1 tablet (25 mg total) by mouth every 8 (eight) hours as needed. (Patient not taking: Reported on 01/19/2018)   . ETOPOSIDE IV Inject into the vein every 21 ( twenty-one) days.   . fexofenadine (ALLEGRA) 180 MG tablet Take 180 mg by mouth daily. 06/17/2013: Stopped for procedure   . HYDROcodone-acetaminophen (NORCO) 5-325 MG per tablet Take 1-2 tablets by mouth every 6 (six) hours as needed. (Patient not taking: Reported   on 01/19/2018)   . magic mouthwash SOLN Take 5 mLs by mouth 4 (four) times daily. (Patient not taking: Reported on 01/19/2018)   . prochlorperazine (COMPAZINE) 10 MG tablet Take 1 tablet (10 mg total) by mouth every 6 (six) hours as needed (Nausea or vomiting). (Patient not taking: Reported on 01/19/2018)    No facility-administered encounter medications on file as of 01/19/2018.     ALLERGIES:  Allergies  Allergen Reactions  . Sulfa Antibiotics Nausea And Vomiting     PHYSICAL EXAM:  ECOG Performance status: 1  Vitals:   01/19/18 1136  BP: (!) 128/55  Pulse: 64  Resp: 20  Temp: (!) 96 F (35.6 C)  SpO2: 98%   Filed Weights   01/19/18 1136  Weight: 205 lb 8 oz (93.2 kg)    Physical Exam  Constitutional: He is oriented to person, place, and time. He appears well-developed and well-nourished.  Cardiovascular: Normal rate, regular rhythm and normal heart sounds.  Pulmonary/Chest: Effort normal and breath sounds normal.  Musculoskeletal: Normal range of motion.  Neurological: He is alert and oriented to person, place, and time.  Skin: Skin is warm and dry.  Psychiatric: He has a normal mood and affect. His behavior is normal. Judgment and thought content normal.     LABORATORY DATA:  I have reviewed the labs as listed.   CBC    Component Value Date/Time   WBC 17.9 (H) 12/19/2017 0846   RBC 4.26 12/19/2017 0846   HGB 12.8 (L) 12/19/2017 0846   HCT 40.1 12/19/2017 0846   PLT 243 12/19/2017 0846   MCV 94.1 12/19/2017 0846   MCH 30.0 12/19/2017 0846   MCHC 31.9 12/19/2017 0846   RDW 18.6 (H) 12/19/2017 0846   LYMPHSABS 2.9 12/19/2017 0846   MONOABS 1.6 (H) 12/19/2017 0846   EOSABS 0.0 12/19/2017 0846   BASOSABS 0.1 12/19/2017 0846   CMP Latest Ref Rng & Units 12/19/2017 11/28/2017 11/07/2017  Glucose 70 - 99 mg/dL 90 148(H) 94  BUN 8 - 23 mg/dL _0 Creatinine 0.61 - 1.24 mg/dL 1.37(H) 1.24 1.25(H)  Sodium 135 - 145 mmol/L 137 137 137  Potassium 3.5 - 5.1 mmol/L 4.8 4.3 4.5  Chloride 98 - 111 mmol/L 105 106 103  CO2 22 - 32 mmol/L _1 Calcium 8.9 - 10.3 mg/dL 8.9 9.0 9.0  Total Protein 6.5 - 8.1 g/dL 7.0 6.5 7.0  Total Bilirubin 0.3 - 1.2 mg/dL 0.9 0.9 0.9  Alkaline Phos 38 - 126 U/L 100 93 84  AST 15 - 41 U/L _2 ALT 0 - 44 U/L _3 I have independently reviewed images of the CT scan dated 01/16/2018 and discussed with the patient.     ASSESSMENT & PLAN:   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HCC) 1.  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 -PET CT scan on 07/27/2017 shows left lung mass with SUV of 14.6, probable metastatic left hilar lymph node, no other metastatic disease. - On 08/17/2017, underwent VATS left upper lobectomy, mediastinal lymphadenectomy. - Pathology shows 2.6 x 2.1 x 2 cm large cell neuroendocrine carcinoma, 2/9 peribronchial lymph nodes positive, station 5, 6, 11 lymph nodes negative, negative visceral pleural involvement - CT of the brain on 10/16/2017 did not show any evidence of intracranial metastasis. - 4 cycles of adjuvant chemotherapy with carboplatin and VP-16 from 10/16/2017  through 12/19/2017.  - He is 4 weeks status post  completion of adjuvant chemotherapy.  His energy levels are slowly improving. -We discussed the results of the CT scan of the chest dated 01/16/2018 shows changes of left upper lobectomy with small left pleural effusion.  I have compared it with the post surgery x-ray from [facility].  The left pleural effusion is stable.  No other evidence of malignancy. -We will plan to see him back in 4 months with repeat CT scan of the chest.  He was encouraged to start exercising to improve his fatigue.  2.  Family history: - Patient has personal history of prostate cancer which was resected in 2013-04-16.  Patient's brother died of lung cancer and was a smoker.  Sister died of ovarian cancer.  Hence I have recommended geneticist evaluation for BRCA and other mutation testing.  He has an appointment next month to meet Roma Kayser.  3.  Atrial fibrillation: -He has a pacemaker.  He is continuing Eliquis twice daily without any bleeding complications.  4.  Insomnia: -He is taking Ambien 10 mg half tablet 2 times at night.  It is helping him to sleep.      Orders placed this encounter:  Orders Placed This Encounter  Procedures  . CT Chest W Contrast  . Comprehensive metabolic panel  . CBC with Differential      Derek Jack, MD [place] [phone]

## 2018-01-26 ENCOUNTER — Other Ambulatory Visit (HOSPITAL_COMMUNITY): Payer: Self-pay | Admitting: Hematology

## 2018-01-26 DIAGNOSIS — G479 Sleep disorder, unspecified: Secondary | ICD-10-CM

## 2018-02-15 ENCOUNTER — Other Ambulatory Visit (HOSPITAL_COMMUNITY): Payer: Managed Care, Other (non HMO)

## 2018-02-15 ENCOUNTER — Encounter (HOSPITAL_COMMUNITY): Payer: Self-pay | Admitting: Genetic Counselor

## 2018-02-15 ENCOUNTER — Inpatient Hospital Stay (HOSPITAL_COMMUNITY): Payer: Managed Care, Other (non HMO) | Attending: Hematology | Admitting: Genetic Counselor

## 2018-02-15 DIAGNOSIS — C7A8 Other malignant neuroendocrine tumors: Secondary | ICD-10-CM

## 2018-02-15 DIAGNOSIS — Z315 Encounter for genetic counseling: Secondary | ICD-10-CM

## 2018-02-15 DIAGNOSIS — Z801 Family history of malignant neoplasm of trachea, bronchus and lung: Secondary | ICD-10-CM

## 2018-02-15 DIAGNOSIS — C61 Malignant neoplasm of prostate: Secondary | ICD-10-CM

## 2018-02-15 DIAGNOSIS — Z8041 Family history of malignant neoplasm of ovary: Secondary | ICD-10-CM

## 2018-02-15 DIAGNOSIS — Z809 Family history of malignant neoplasm, unspecified: Secondary | ICD-10-CM

## 2018-02-15 DIAGNOSIS — Z8546 Personal history of malignant neoplasm of prostate: Secondary | ICD-10-CM

## 2018-02-15 LAB — COMPREHENSIVE METABOLIC PANEL
ALBUMIN: 4.2 g/dL (ref 3.5–5.0)
ALT: 20 U/L (ref 0–44)
AST: 22 U/L (ref 15–41)
Alkaline Phosphatase: 71 U/L (ref 38–126)
Anion gap: 7 (ref 5–15)
BUN: 14 mg/dL (ref 8–23)
CALCIUM: 9 mg/dL (ref 8.9–10.3)
CHLORIDE: 103 mmol/L (ref 98–111)
CO2: 26 mmol/L (ref 22–32)
Creatinine, Ser: 1.24 mg/dL (ref 0.61–1.24)
GFR calc Af Amer: >60 mL/min (ref >60)
GFR calc non Af Amer: 57 mL/min — ABNORMAL LOW (ref >60)
Glucose, Bld: 100 mg/dL — ABNORMAL HIGH (ref 70–99)
POTASSIUM: 4.3 mmol/L (ref 3.5–5.1)
SODIUM: 136 mmol/L (ref 135–145)
Total Bilirubin: 1 mg/dL (ref 0.3–1.2)
Total Protein: 7.1 g/dL (ref 6.5–8.1)

## 2018-02-15 LAB — CBC WITH DIFFERENTIAL/PLATELET
ABS IMMATURE GRANULOCYTES: 0.01 10*3/uL (ref 0.00–0.07)
Basophils Absolute: 0 10*3/uL (ref 0.0–0.1)
Basophils Relative: 0 %
EOS ABS: 0.1 10*3/uL (ref 0.0–0.5)
EOS PCT: 1 %
HCT: 44.2 % (ref 39.0–52.0)
Hemoglobin: 14.1 g/dL (ref 13.0–17.0)
Immature Granulocytes: 0 %
Lymphocytes Relative: 28 %
Lymphs Abs: 2.2 10*3/uL (ref 0.7–4.0)
MCH: 30.4 pg (ref 26.0–34.0)
MCHC: 31.9 g/dL (ref 30.0–36.0)
MCV: 95.3 fL (ref 80.0–100.0)
MONO ABS: 0.5 10*3/uL (ref 0.1–1.0)
Monocytes Relative: 6 %
NRBC: 0 % (ref 0.0–0.2)
Neutro Abs: 5.1 10*3/uL (ref 1.7–7.7)
Neutrophils Relative %: 65 %
PLATELETS: 186 10*3/uL (ref 150–400)
RBC: 4.64 MIL/uL (ref 4.22–5.81)
RDW: 12.3 % (ref 11.5–15.5)
WBC: 8 10*3/uL (ref 4.0–10.5)

## 2018-02-15 NOTE — Progress Notes (Signed)
REFERRING PROVIDER: Derek Jack, MD [address] [place], [place] [address]  PRIMARY PROVIDER:  [name], MD  PRIMARY REASON FOR VISIT:  1. 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 Family history of ovarian cancer   3. 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, a 77 y.o. male, was seen for a [place] cancer genetics consultation at the request of Dr. Delton Coombes due to a personal and family history of cancer.  [name] presents to clinic today to discuss the possibility of a hereditary predisposition to cancer, genetic testing, and to further clarify his future cancer risks, as well as potential cancer risks for family members.   In 2014, at the age of 12, [name] was diagnosed with cancer of the prostate. The Gleason score was 7. This was treated with proctectomy.  In 2019, at the age of 77, [name] was diagnosed with a neuroendocrine carcinoma.  This was treated with surgery.     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[place])   07/27/2017 PET scan    Left lower lobe lung mass with SUV of 14.6, probable metastatic left hilar lymph node, no evidence of distant metastasis    08/17/2017 Surgery    VATS left upper lobe lobectomy, mediastinal lymphadenectomy, pathology consistent with 2.6 cm x 2.1 x 2 cm large cell neuroendocrine carcinoma, station 5, station 6, station 11 lymph nodes not involved, 2/9 peribronchial lymph nodes positive for large cell neuroendocrine carcinoma, negative visceral pleural involvement    10/03/2017 -  Chemotherapy    The patient had palonosetron (ALOXI) injection 0.25 mg, 0.25 mg, Intravenous,  Once, 4 of 4 cycles Administration: 0.25 mg (10/16/2017), 0.25 mg (11/07/2017), 0.25 mg (11/28/2017), 0.25 mg (12/19/2017) pegfilgrastim (NEULASTA ONPRO KIT) injection 6 mg, 6 mg, Subcutaneous, Once, 3 of 3 cycles Administration: 6 mg (10/18/2017), 6 mg (11/09/2017), 6 mg  (11/30/2017) pegfilgrastim-cbqv (UDENYCA) injection 6 mg, 6 mg, Subcutaneous, Once, 1 of 1 cycle CARBOplatin (PARAPLATIN) 500 mg in sodium chloride 0.9 % 250 mL chemo infusion, 500 mg (100 % of original dose 497 mg), Intravenous,  Once, 4 of 4 cycles Dose modification:   (original dose 497 mg, Cycle 1),   (original dose 467.5 mg, Cycle 2),   (original dose 470 mg, Cycle 3),   (original dose 437.5 mg, Cycle 4) Administration: 500 mg (10/16/2017), 470 mg (11/07/2017), 470 mg (11/28/2017), 440 mg (12/19/2017) etoposide (VEPESID) 200 mg in sodium chloride 0.9 % 500 mL chemo infusion, 90 mg/m2 = 220 mg, Intravenous,  Once, 4 of 4 cycles Administration: 200 mg (10/16/2017), 200 mg (10/17/2017), 200 mg (10/18/2017), 200 mg (11/07/2017), 200 mg (11/08/2017), 200 mg (11/09/2017), 200 mg (11/28/2017), 200 mg (11/29/2017), 200 mg (11/30/2017), 200 mg (12/19/2017), 200 mg (12/20/2017), 200 mg (12/21/2017) fosaprepitant (EMEND) 150 mg, dexamethasone (DECADRON) 12 mg in sodium chloride 0.9 % 145 mL IVPB, , Intravenous,  Once, 4 of 4 cycles Administration:  (10/16/2017),  (11/07/2017),  (11/28/2017),  (12/19/2017)  for chemotherapy treatment.       Past Medical History:  Diagnosis Date  . Arthritis   . Cancer [facility])    prostate  . Family history of lung cancer   . Family history of ovarian cancer   . Hematuria   . Nocturia     Past Surgical History:  Procedure Laterality Date  . [place]   lower back  . CHOLECYSTECTOMY  2000  . LYMPHADENECTOMY Bilateral 06/24/2013   Procedure: LYMPHADENECTOMY;  Surgeon: [name], MD;  Location: [facility];  Service: Urology;  Laterality: Bilateral;  . MENISCUS REPAIR Right jan 2014  . PROSTATE BIOPSY  Apr 26, 2013  . ROBOT ASSISTED LAPAROSCOPIC RADICAL PROSTATECTOMY N/A 06/24/2013   Procedure: ROBOTIC ASSISTED LAPAROSCOPIC RADICAL PROSTATECTOMY LEVEL 2;  Surgeon: [name], MD;  Location: [facility];  Service: Urology;  Laterality: N/A;    Social History   Socioeconomic  History  . Marital status: Married    Spouse name: Not on file  . Number of children: Not on file  . Years of education: Not on file  . Highest education level: Not on file  Occupational History  . Not on file  Social Needs  . Financial resource strain: Not on file  . Food insecurity:    Worry: Not on file    Inability: Not on file  . Transportation needs:    Medical: Not on file    Non-medical: Not on file  Tobacco Use  . Smoking status: Former Smoker    Packs/day: 1.00    Types: Cigarettes    Last attempt to quit: 03/07/1978    Years since quitting: 39.9  . Smokeless tobacco: Never Used  Substance and Sexual Activity  . Alcohol use: Yes    Comment:  occasional  . Drug use: No  . Sexual activity: Not on file  Lifestyle  . Physical activity:    Days per week: Not on file    Minutes per session: Not on file  . Stress: Not on file  Relationships  . Social connections:    Talks on phone: Not on file    Gets together: Not on file    Attends religious service: Not on file    Active member of club or organization: Not on file    Attends meetings of clubs or organizations: Not on file    Relationship status: Not on file  Other Topics Concern  . Not on file  Social History Narrative  . Not on file     FAMILY HISTORY:  We obtained a detailed, 4-generation family history.  Significant diagnoses are listed below: Family History  Problem Relation Age of Onset  . Deep vein thrombosis Mother   . Heart failure Father   . Ovarian cancer Sister 60  . Lung cancer Brother 73  . Cancer Maternal Aunt        NOS    The patient has one son who is cancer free.  He has one brother and two sisters.  One sister had ovarian cancer at 65, and his brother was diagnosed with lung cancer at 51.  Both parents are deceased.  The patient's mother died of thrombosis.  She had three sisters and two brothers.  One sister had an unknown cancer.  The maternal grandparents are deceased.  The  patient's father died of heart disease.  He had three sisters and a brother who are all cancer free.  The paternal grandparents are deceased.  Mr. Mcmurtry is unaware of previous family history of genetic testing for hereditary cancer risks. Patient's maternal ancestors are of Caucasian descent, and paternal ancestors are of Caucasian descent. There is no reported Ashkenazi Jewish ancestry. There is no known consanguinity.  GENETIC COUNSELING ASSESSMENT: DOMENIQUE SOUTHERS is a 77 y.o. male with a personal and family history of cancer which is somewhat suggestive of a hereditary cancer syndrome and predisposition to cancer. We, therefore, discussed  and recommended the following at today's visit.   DISCUSSION: The patient's family history is somewhat concerning for hereditary breast and ovarian cancer syndrome.  We discussed that about 15% of prostate cancer and about 15-20% of ovarian cancer is hereditary with most cases of these due to BRCA mutations.  There are other prostate and ovarian cancer genes as well.  The patient has a neuroendocrine tumor of the lung and has a family history of lung cancer.  These cancers are different, even though the neuroendocrine tumor is of the lung.  There are genes that can increase the risk for hereditary neuroendocrine tumors, and those that can increase there risk for hereditary lung cancers.  We can screen/test for these.  We reviewed the characteristics, features and inheritance patterns of hereditary cancer syndromes. We also discussed genetic testing, including the appropriate family members to test, the process of testing, insurance coverage and turn-around-time for results. We discussed the implications of a negative, positive and/or variant of uncertain significant result. We recommended Mr. Pesta pursue genetic testing for the common hereditary cancer gene panel, plus EGFR and RET.  The Hereditary Gene Panel offered by [facility] includes sequencing and/or deletion  duplication testing of the following 47 genes: APC, ATM, AXIN2, BARD1, BMPR1A, BRCA1, BRCA2, BRIP1, CDH1, CDK4, CDKN2A (p14ARF), CDKN2A (p16INK4a), CHEK2, CTNNA1, DICER1, EPCAM (Deletion/duplication testing only), GREM1 (promoter region deletion/duplication testing only), KIT, MEN1, MLH1, MSH2, MSH3, MSH6, MUTYH, NBN, NF1, NHTL1, PALB2, PDGFRA, PMS2, POLD1, POLE, PTEN, RAD50, RAD51C, RAD51D, SDHB, SDHC, SDHD, SMAD4, SMARCA4. STK11, TP53, TSC1, TSC2, and VHL.  The following genes were evaluated for sequence changes only: SDHA and HOXB13 c.251G>A variant only.   Based on Mr. Penafiel's personal and family history of cancer, he meets medical criteria for genetic testing. Despite that he meets criteria, he may still have an out of pocket cost. We discussed that if his out of pocket cost for testing is over $100, the laboratory will call and confirm whether he wants to proceed with testing.  If the out of pocket cost of testing is less than $100 he will be billed by the genetic testing laboratory.   We discussed that some people do not want to undergo genetic testing due to fear of genetic discrimination.  A federal law called the Genetic Information Non-Discrimination Act (GINA) of 2008 helps protect individuals against genetic discrimination based on their genetic test results.  It impacts both health insurance and employment.  With health insurance, it protects against increased premiums, being kicked off insurance or being forced to take a test in order to be insured.  For employment it protects against hiring, firing and promoting decisions based on genetic test results.  Health status due to a cancer diagnosis is not protected under GINA.  PLAN: After considering the risks, benefits, and limitations, Mr. Huhn  provided informed consent to pursue genetic testing and the blood sample was sent to [facility] for analysis of the common hereditary cancer panel with two additional genes. Results should be  available within approximately 2-3 weeks' time, at which point they will be disclosed by telephone to Mr. Junkin, as will any additional recommendations warranted by these results. Mr. Mayse will receive a summary of his genetic counseling visit and a copy of his results once available. This information will also be available in Epic. We encouraged Mr. Garcon to remain in contact with cancer genetics annually so that we can continuously update the family history and inform him of any changes in cancer genetics  and testing that may be of benefit for his family. Mr. Arteaga questions were answered to his satisfaction today. Our contact information was provided should additional questions or concerns arise.  Lastly, we encouraged Mr. Schnitzer to remain in contact with cancer genetics annually so that we can continuously update the family history and inform him of any changes in cancer genetics and testing that may be of benefit for this family.   Mr.  Kettles questions were answered to his satisfaction today. Our contact information was provided should additional questions or concerns arise. Thank you for the referral and allowing [place] to share in the care of your patient.    P. Florene Glen, [place], [facility] Certified Genetic Counselor [email or web address] phone: [phone]  The patient was seen for a total of 55 minutes in face-to-face genetic counseling.  This patient was discussed with Drs. Magrinat, Lindi Adie and/or Burr Medico who agrees with the above.    _______________________________________________________________________ For Office Staff:  Number of people involved in session: 2 Was an Intern/ student involved with case: no

## 2018-02-19 ENCOUNTER — Telehealth (HOSPITAL_COMMUNITY): Payer: Self-pay | Admitting: Hematology

## 2018-02-19 NOTE — Telephone Encounter (Signed)
Mailed claims to pt per their request.

## 2018-02-23 ENCOUNTER — Telehealth: Payer: Self-pay | Admitting: Genetic Counselor

## 2018-02-23 ENCOUNTER — Encounter: Payer: Self-pay | Admitting: Genetic Counselor

## 2018-02-23 ENCOUNTER — Ambulatory Visit: Payer: Self-pay | Admitting: Genetic Counselor

## 2018-02-23 DIAGNOSIS — Z1379 Encounter for other screening for genetic and chromosomal anomalies: Secondary | ICD-10-CM | POA: Insufficient documentation

## 2018-02-23 NOTE — Progress Notes (Signed)
HPI:  Mr. Vivier was previously seen in the [place] clinic due to a personal and family history of cancer and concerns regarding a hereditary predisposition to cancer. Please refer to our prior cancer genetics clinic note for more information regarding Mr. Perkovich's medical, social and family histories, and our assessment and recommendations, at the time. Mr. Tomkins recent genetic test results were disclosed to him, as were recommendations warranted by these results. These results and recommendations are discussed in more detail below.  CANCER HISTORY:    Prostate cancer ([place])   06/24/2013 Initial Diagnosis    Prostate cancer ([place])    02/22/2018 Genetic Testing    Negative genetic testing on the common hereditary cancer panel.  The Hereditary Gene Panel offered by [facility] includes sequencing and/or deletion duplication testing of the following 47 genes: APC, ATM, AXIN2, BARD1, BMPR1A, BRCA1, BRCA2, BRIP1, CDH1, CDK4, CDKN2A (p14ARF), CDKN2A (p16INK4a), CHEK2, CTNNA1, DICER1, EPCAM (Deletion/duplication testing only), GREM1 (promoter region deletion/duplication testing only), KIT, MEN1, MLH1, MSH2, MSH3, MSH6, MUTYH, NBN, NF1, NHTL1, PALB2, PDGFRA, PMS2, POLD1, POLE, PTEN, RAD50, RAD51C, RAD51D, RET, SDHB, SDHC, SDHD, SMAD4, SMARCA4. STK11, TP53, TSC1, TSC2, and VHL.  The following genes were evaluated for sequence changes only: SDHA and HOXB13 c.251G>A variant only. The report date is February 22, 2018.     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[place])   07/27/2017 PET scan    Left lower lobe lung mass with SUV of 14.6, probable metastatic left hilar lymph node, no evidence of distant metastasis    08/17/2017 Surgery    VATS left upper lobe lobectomy, mediastinal lymphadenectomy, pathology consistent with 2.6 cm x 2.1 x 2 cm large cell neuroendocrine carcinoma, station 5, station 6, station 11 lymph nodes not involved, 2/9 peribronchial lymph nodes positive for large cell  neuroendocrine carcinoma, negative visceral pleural involvement    10/03/2017 -  Chemotherapy    The patient had palonosetron (ALOXI) injection 0.25 mg, 0.25 mg, Intravenous,  Once, 4 of 4 cycles Administration: 0.25 mg (10/16/2017), 0.25 mg (11/07/2017), 0.25 mg (11/28/2017), 0.25 mg (12/19/2017) pegfilgrastim (NEULASTA ONPRO KIT) injection 6 mg, 6 mg, Subcutaneous, Once, 3 of 3 cycles Administration: 6 mg (10/18/2017), 6 mg (11/09/2017), 6 mg (11/30/2017) pegfilgrastim-cbqv (UDENYCA) injection 6 mg, 6 mg, Subcutaneous, Once, 1 of 1 cycle CARBOplatin (PARAPLATIN) 500 mg in sodium chloride 0.9 % 250 mL chemo infusion, 500 mg (100 % of original dose 497 mg), Intravenous,  Once, 4 of 4 cycles Dose modification:   (original dose 497 mg, Cycle 1),   (original dose 467.5 mg, Cycle 2),   (original dose 470 mg, Cycle 3),   (original dose 437.5 mg, Cycle 4) Administration: 500 mg (10/16/2017), 470 mg (11/07/2017), 470 mg (11/28/2017), 440 mg (12/19/2017) etoposide (VEPESID) 200 mg in sodium chloride 0.9 % 500 mL chemo infusion, 90 mg/m2 = 220 mg, Intravenous,  Once, 4 of 4 cycles Administration: 200 mg (10/16/2017), 200 mg (10/17/2017), 200 mg (10/18/2017), 200 mg (11/07/2017), 200 mg (11/08/2017), 200 mg (11/09/2017), 200 mg (11/28/2017), 200 mg (11/29/2017), 200 mg (11/30/2017), 200 mg (12/19/2017), 200 mg (12/20/2017), 200 mg (12/21/2017) fosaprepitant (EMEND) 150 mg, dexamethasone (DECADRON) 12 mg in sodium chloride 0.9 % 145 mL IVPB, , Intravenous,  Once, 4 of 4 cycles Administration:  (10/16/2017),  (11/07/2017),  (11/28/2017),  (12/19/2017)  for chemotherapy treatment.     02/22/2018 Genetic Testing    Negative genetic testing on the common hereditary cancer panel.  The Hereditary Gene Panel offered by [facility] includes sequencing and/or deletion duplication  testing of the following 47 genes: APC, ATM, AXIN2, BARD1, BMPR1A, BRCA1, BRCA2, BRIP1, CDH1, CDK4, CDKN2A (p14ARF), CDKN2A (p16INK4a), CHEK2, CTNNA1, DICER1, EPCAM  (Deletion/duplication testing only), GREM1 (promoter region deletion/duplication testing only), KIT, MEN1, MLH1, MSH2, MSH3, MSH6, MUTYH, NBN, NF1, NHTL1, PALB2, PDGFRA, PMS2, POLD1, POLE, PTEN, RAD50, RAD51C, RAD51D, RET, SDHB, SDHC, SDHD, SMAD4, SMARCA4. STK11, TP53, TSC1, TSC2, and VHL.  The following genes were evaluated for sequence changes only: SDHA and HOXB13 c.251G>A variant only. The report date is February 22, 2018.     FAMILY HISTORY:  We obtained a detailed, 4-generation family history.  Significant diagnoses are listed below: Family History  Problem Relation Age of Onset  . Deep vein thrombosis Mother   . Heart failure Father   . Ovarian cancer Sister 16  . Lung cancer Brother 64  . Cancer Maternal Aunt        NOS    The patient has one son who is cancer free.  He has one brother and two sisters.  One sister had ovarian cancer at 75, and his brother was diagnosed with lung cancer at 77.  Both parents are deceased.  The patient's mother died of thrombosis.  She had three sisters and two brothers.  One sister had an unknown cancer.  The maternal grandparents are deceased.  The patient's father died of heart disease.  He had three sisters and a brother who are all cancer free.  The paternal grandparents are deceased.  Mr. Shawler is unaware of previous family history of genetic testing for hereditary cancer risks. Patient's maternal ancestors are of Caucasian descent, and paternal ancestors are of Caucasian descent. There is no reported Ashkenazi Jewish ancestry. There is no known consanguinity.  GENETIC TEST RESULTS: Genetic testing reported out on February 22, 2018 through the common hereditary cancer panel found no deleterious mutations.  The Hereditary Gene Panel offered by [facility] includes sequencing and/or deletion duplication testing of the following 47 genes: APC, ATM, AXIN2, BARD1, BMPR1A, BRCA1, BRCA2, BRIP1, CDH1, CDK4, CDKN2A (p14ARF), CDKN2A (p16INK4a), CHEK2,  CTNNA1, DICER1,EGFR, EPCAM (Deletion/duplication testing only), GREM1 (promoter region deletion/duplication testing only), KIT, MEN1, MLH1, MSH2, MSH3, MSH6, MUTYH, NBN, NF1, NHTL1, PALB2, PDGFRA, PMS2, POLD1, POLE, PTEN, RAD50, RAD51C, RAD51D, RET, SDHB, SDHC, SDHD, SMAD4, SMARCA4. STK11, TP53, TSC1, TSC2, and VHL.  The following genes were evaluated for sequence changes only: SDHA and HOXB13 c.251G>A variant only.  The test report has been scanned into EPIC and is located under the Molecular Pathology section of the Results Review tab.    We discussed with Mr. Cubbage that since the current genetic testing is not perfect, it is possible there may be a gene mutation in one of these genes that current testing cannot detect, but that chance is small.  We also discussed, that it is possible that another gene that has not yet been discovered, or that we have not yet tested, is responsible for the cancer diagnoses in the family, and it is, therefore, important to remain in touch with cancer genetics in the future so that we can continue to offer Mr. Nydam the most up to date genetic testing.   CANCER SCREENING RECOMMENDATIONS: This normal result is reassuring and indicates that Mr. Mccleery does not likely have an increased risk of cancer due to a mutation in one of these genes.  We, therefore, recommended  Mr. Branch continue to follow the cancer screening guidelines provided by his primary healthcare providers.   An individual's cancer risk and medical management are  not determined by genetic test results alone. Overall cancer risk assessment incorporates additional factors, including personal medical history, family history, and any available genetic information that may result in a personalized plan for cancer prevention and surveillance.  RECOMMENDATIONS FOR FAMILY MEMBERS:  Women in this family might be at some increased risk of developing cancer, over the general population risk, simply due to the family  history of cancer.  We recommended women in this family have a yearly mammogram beginning at age 13, or 56 years younger than the earliest onset of cancer, an annual clinical breast exam, and perform monthly breast self-exams. Women in this family should also have a gynecological exam as recommended by their primary provider. All family members should have a colonoscopy by age 79.  FOLLOW-UP: Lastly, we discussed with Mr. Debes that cancer genetics is a rapidly advancing field and it is possible that new genetic tests will be appropriate for him and/or his family members in the future. We encouraged him to remain in contact with cancer genetics on an annual basis so we can update his personal and family histories and let him know of advances in cancer genetics that may benefit this family.   Our contact number was provided. Mr. Titsworth questions were answered to his satisfaction, and he knows he is welcome to call us at anytime with additional questions or concerns.   Roma Kayser, MS, [facility] Certified Genetic Counselor [email or web address]

## 2018-02-23 NOTE — Telephone Encounter (Signed)
Revealed negative genetic testing.  Discussed that we do not know why he has prostate and neuroendocrine lung cancer or why there is cancer in the family. It could be due to a different gene that we are not testing, or maybe our current technology may not be able to pick something up.  It will be important for him to keep in contact with genetics to keep up with whether additional testing may be needed.

## 2018-03-05 ENCOUNTER — Other Ambulatory Visit (HOSPITAL_COMMUNITY): Payer: Self-pay | Admitting: Hematology

## 2018-03-05 DIAGNOSIS — G479 Sleep disorder, unspecified: Secondary | ICD-10-CM

## 2018-03-06 ENCOUNTER — Telehealth (HOSPITAL_COMMUNITY): Payer: Self-pay | Admitting: Hematology

## 2018-03-06 NOTE — Telephone Encounter (Signed)
Printed pts itemized bills and will mail to pts home address.

## 2018-03-08 ENCOUNTER — Other Ambulatory Visit (HOSPITAL_COMMUNITY): Payer: Self-pay | Admitting: *Deleted

## 2018-03-08 DIAGNOSIS — G479 Sleep disorder, unspecified: Secondary | ICD-10-CM

## 2018-03-08 MED ORDER — ZOLPIDEM TARTRATE 10 MG PO TABS: 10.0000 mg | ORAL_TABLET | Freq: Every evening | ORAL | 2 refills | Status: DC | PRN

## 2018-05-18 ENCOUNTER — Inpatient Hospital Stay (HOSPITAL_COMMUNITY): Payer: Medicare HMO | Attending: Hematology

## 2018-05-18 ENCOUNTER — Encounter (HOSPITAL_COMMUNITY): Payer: Self-pay

## 2018-05-18 ENCOUNTER — Ambulatory Visit (HOSPITAL_COMMUNITY)
Admission: RE | Admit: 2018-05-18 | Discharge: 2018-05-18 | Disposition: A | Discharge status: Home / Self Care | Payer: Medicare HMO | Source: Ambulatory Visit | Attending: Hematology | Admitting: Hematology

## 2018-05-18 ENCOUNTER — Other Ambulatory Visit: Payer: Self-pay

## 2018-05-18 DIAGNOSIS — J90 Pleural effusion, not elsewhere classified: Secondary | ICD-10-CM | POA: Diagnosis not present

## 2018-05-18 DIAGNOSIS — Z87891 Personal history of nicotine dependence: Secondary | ICD-10-CM | POA: Diagnosis not present

## 2018-05-18 DIAGNOSIS — E279 Disorder of adrenal gland, unspecified: Secondary | ICD-10-CM | POA: Insufficient documentation

## 2018-05-18 DIAGNOSIS — R066 Hiccough: Secondary | ICD-10-CM | POA: Insufficient documentation

## 2018-05-18 DIAGNOSIS — I4891 Unspecified atrial fibrillation: Secondary | ICD-10-CM | POA: Diagnosis not present

## 2018-05-18 DIAGNOSIS — C7A8 Other malignant neuroendocrine tumors: Secondary | ICD-10-CM

## 2018-05-18 LAB — COMPREHENSIVE METABOLIC PANEL
ALT: 16 U/L (ref 0–44)
AST: 18 U/L (ref 15–41)
Albumin: 4.4 g/dL (ref 3.5–5.0)
Alkaline Phosphatase: 79 U/L (ref 38–126)
Anion gap: 8 (ref 5–15)
BUN: 20 mg/dL (ref 8–23)
CO2: 24 mmol/L (ref 22–32)
Calcium: 9.3 mg/dL (ref 8.9–10.3)
Chloride: 104 mmol/L (ref 98–111)
Creatinine, Ser: 1.45 mg/dL — ABNORMAL HIGH (ref 0.61–1.24)
GFR calc Af Amer: 54 mL/min — ABNORMAL LOW (ref >60)
GFR calc non Af Amer: 47 mL/min — ABNORMAL LOW (ref >60)
GLUCOSE: 123 mg/dL — AB (ref 70–99)
Potassium: 4.1 mmol/L (ref 3.5–5.1)
Sodium: 136 mmol/L (ref 135–145)
Total Bilirubin: 1.5 mg/dL — ABNORMAL HIGH (ref 0.3–1.2)
Total Protein: 7.5 g/dL (ref 6.5–8.1)

## 2018-05-18 LAB — CBC WITH DIFFERENTIAL/PLATELET
ABS IMMATURE GRANULOCYTES: 0.03 10*3/uL (ref 0.00–0.07)
Basophils Absolute: 0 10*3/uL (ref 0.0–0.1)
Basophils Relative: 0 %
Eosinophils Absolute: 0.1 10*3/uL (ref 0.0–0.5)
Eosinophils Relative: 0 %
HCT: 48.7 % (ref 39.0–52.0)
HEMOGLOBIN: 15.8 g/dL (ref 13.0–17.0)
Immature Granulocytes: 0 %
Lymphocytes Relative: 13 %
Lymphs Abs: 1.7 10*3/uL (ref 0.7–4.0)
MCH: 28.7 pg (ref 26.0–34.0)
MCHC: 32.4 g/dL (ref 30.0–36.0)
MCV: 88.5 fL (ref 80.0–100.0)
Monocytes Absolute: 1.1 10*3/uL — ABNORMAL HIGH (ref 0.1–1.0)
Monocytes Relative: 9 %
Neutro Abs: 9.9 10*3/uL — ABNORMAL HIGH (ref 1.7–7.7)
Neutrophils Relative %: 78 %
Platelets: 204 10*3/uL (ref 150–400)
RBC: 5.5 MIL/uL (ref 4.22–5.81)
RDW: 14.7 % (ref 11.5–15.5)
WBC: 12.9 10*3/uL — ABNORMAL HIGH (ref 4.0–10.5)
nRBC: 0 % (ref 0.0–0.2)

## 2018-05-18 MED ORDER — IOHEXOL 300 MG/ML  SOLN
75.0000 mL | Freq: Once | INTRAMUSCULAR | Status: AC | PRN
  Administered 2018-05-18: 60 mL via INTRAVENOUS

## 2018-05-23 ENCOUNTER — Inpatient Hospital Stay (HOSPITAL_COMMUNITY): Payer: Medicare HMO | Admitting: Hematology

## 2018-05-23 ENCOUNTER — Other Ambulatory Visit: Payer: Self-pay

## 2018-05-23 ENCOUNTER — Encounter (HOSPITAL_COMMUNITY): Payer: Self-pay | Admitting: Hematology

## 2018-05-23 VITALS — BP 123/70 | HR 95 | Temp 98.7°F | Resp 18 | Wt 203.0 lb

## 2018-05-23 DIAGNOSIS — R066 Hiccough: Secondary | ICD-10-CM

## 2018-05-23 DIAGNOSIS — E279 Disorder of adrenal gland, unspecified: Secondary | ICD-10-CM | POA: Diagnosis not present

## 2018-05-23 DIAGNOSIS — I4891 Unspecified atrial fibrillation: Secondary | ICD-10-CM

## 2018-05-23 DIAGNOSIS — Z87891 Personal history of nicotine dependence: Secondary | ICD-10-CM

## 2018-05-23 DIAGNOSIS — C7A8 Other malignant neuroendocrine tumors: Secondary | ICD-10-CM | POA: Diagnosis not present

## 2018-05-23 DIAGNOSIS — J90 Pleural effusion, not elsewhere classified: Secondary | ICD-10-CM | POA: Diagnosis not present

## 2018-05-23 MED ORDER — CHLORPROMAZINE HCL 25 MG PO TABS: 25.0000 mg | ORAL_TABLET | Freq: Three times a day (TID) | ORAL | 0 refills | Status: DC | PRN

## 2018-05-23 MED ORDER — AMOXICILLIN 500 MG PO TABS: 500.0000 mg | ORAL_TABLET | Freq: Two times a day (BID) | ORAL | 0 refills | Status: DC

## 2018-05-23 NOTE — Patient Instructions (Addendum)
[place] at [facility] Discharge Instructions  You were seen today by Dr. Delton Coombes. He went over your recent scan and lab results. Everything looked good.  He will see you back in 4 months for labs, CT scan and follow up.   Thank you for choosing [place] at [facility] to provide your oncology and hematology care.  To afford each patient quality time with our provider, please arrive at least 15 minutes before your scheduled appointment time.   If you have a lab appointment with the [facility] please come in thru the  Main Entrance and check in at the main information desk  You need to re-schedule your appointment should you arrive 10 or more minutes late.  We strive to give you quality time with our providers, and arriving late affects you and other patients whose appointments are after yours.  Also, if you no show three or more times for appointments you may be dismissed from the clinic at the providers discretion.     Again, thank you for choosing [facility].  Our hope is that these requests will decrease the amount of time that you wait before being seen by our physicians.       _____________________________________________________________  Should you have questions after your visit to [facility], please contact our office at [phone] between the hours of 8:00 a.m. and 4:30 p.m.  Voicemails left after 4:00 p.m. will not be returned until the following business day.  For prescription refill requests, have your pharmacy contact our office and allow 72 hours.    Cancer Center Support Programs:   > Cancer Support Group  2nd Tuesday of the month 1pm-2pm, Journey Room

## 2018-05-23 NOTE — Progress Notes (Signed)
Douglas Baxter, [place] [address]   CLINIC:  Medical Oncology/Hematology  PCP:  Douglas Baxter, [place] [address] [phone]   REASON FOR VISIT:  Follow-up for large cell neuroendocrine carcinoma  CURRENT THERAPY: Observation.  BRIEF ONCOLOGIC HISTORY:    Prostate cancer (Douglas Baxter)   06/24/2013 Initial Diagnosis    Prostate cancer (Douglas Baxter)    02/22/2018 Genetic Testing    Negative genetic testing on the common hereditary cancer panel.  The Hereditary Gene Panel offered by [facility] includes sequencing and/or deletion duplication testing of the following 47 genes: APC, ATM, AXIN2, BARD1, BMPR1A, BRCA1, BRCA2, BRIP1, CDH1, CDK4, CDKN2A (p14ARF), CDKN2A (p16INK4a), CHEK2, CTNNA1, DICER1, EPCAM (Deletion/duplication testing only), GREM1 (promoter region deletion/duplication testing only), KIT, MEN1, MLH1, MSH2, MSH3, MSH6, MUTYH, NBN, NF1, NHTL1, PALB2, PDGFRA, PMS2, POLD1, POLE, PTEN, RAD50, RAD51C, RAD51D, RET, SDHB, SDHC, SDHD, SMAD4, SMARCA4. STK11, TP53, TSC1, TSC2, and VHL.  The following genes were evaluated for sequence changes only: SDHA and HOXB13 c.251G>A variant only. The report date is February 22, 2018.     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Douglas Baxter)   07/27/2017 PET scan    Left lower lobe lung mass with SUV of 14.6, probable metastatic left hilar lymph node, no evidence of distant metastasis    08/17/2017 Surgery    VATS left upper lobe lobectomy, mediastinal lymphadenectomy, pathology consistent with 2.6 cm x 2.1 x 2 cm large cell neuroendocrine carcinoma, station 5, station 6, station 11 lymph nodes not involved, 2/9 peribronchial lymph nodes positive for large cell neuroendocrine carcinoma, negative visceral pleural involvement    10/03/2017 -  Chemotherapy    The patient had palonosetron (ALOXI) injection 0.25 mg, 0.25 mg, Intravenous,  Once, 4 of 4 cycles Administration: 0.25 mg (10/16/2017), 0.25 mg (11/07/2017),  0.25 mg (11/28/2017), 0.25 mg (12/19/2017) pegfilgrastim (NEULASTA ONPRO KIT) injection 6 mg, 6 mg, Subcutaneous, Once, 3 of 3 cycles Administration: 6 mg (10/18/2017), 6 mg (11/09/2017), 6 mg (11/30/2017) pegfilgrastim-cbqv (UDENYCA) injection 6 mg, 6 mg, Subcutaneous, Once, 1 of 1 cycle CARBOplatin (PARAPLATIN) 500 mg in sodium chloride 0.9 % 250 mL chemo infusion, 500 mg (100 % of original dose 497 mg), Intravenous,  Once, 4 of 4 cycles Dose modification:   (original dose 497 mg, Cycle 1),   (original dose 467.5 mg, Cycle 2),   (original dose 470 mg, Cycle 3),   (original dose 437.5 mg, Cycle 4) Administration: 500 mg (10/16/2017), 470 mg (11/07/2017), 470 mg (11/28/2017), 440 mg (12/19/2017) etoposide (VEPESID) 200 mg in sodium chloride 0.9 % 500 mL chemo infusion, 90 mg/m2 = 220 mg, Intravenous,  Once, 4 of 4 cycles Administration: 200 mg (10/16/2017), 200 mg (10/17/2017), 200 mg (10/18/2017), 200 mg (11/07/2017), 200 mg (11/08/2017), 200 mg (11/09/2017), 200 mg (11/28/2017), 200 mg (11/29/2017), 200 mg (11/30/2017), 200 mg (12/19/2017), 200 mg (12/20/2017), 200 mg (12/21/2017) fosaprepitant (EMEND) 150 mg, dexamethasone (DECADRON) 12 mg in sodium chloride 0.9 % 145 mL IVPB, , Intravenous,  Once, 4 of 4 cycles Administration:  (10/16/2017),  (11/07/2017),  (11/28/2017),  (12/19/2017)  for chemotherapy treatment.     02/22/2018 Genetic Testing    Negative genetic testing on the common hereditary cancer panel.  The Hereditary Gene Panel offered by [facility] includes sequencing and/or deletion duplication testing of the following 47 genes: APC, ATM, AXIN2, BARD1, BMPR1A, BRCA1, BRCA2, BRIP1, CDH1, CDK4, CDKN2A (p14ARF), CDKN2A (p16INK4a), CHEK2, CTNNA1, DICER1, EPCAM (Deletion/duplication testing only), GREM1 (promoter region deletion/duplication testing only), KIT, MEN1, MLH1, MSH2, MSH3,  MSH6, MUTYH, NBN, NF1, NHTL1, PALB2, PDGFRA, PMS2, POLD1, POLE, PTEN, RAD50, RAD51C, RAD51D, RET, SDHB, SDHC, SDHD, SMAD4, SMARCA4. STK11,  TP53, TSC1, TSC2, and VHL.  The following genes were evaluated for sequence changes only: SDHA and HOXB13 c.251G>A variant only. The report date is February 22, 2018.        INTERVAL HISTORY:  Douglas Baxter 78 y.o. male returns for routine follow-up. He is here today with his wife. He states that he has started having hiccups again for the past few days. He states that he feels like he has a sinus infection. Denies any nausea, vomiting, or diarrhea. Denies any new pains. Had not noticed any recent bleeding such as epistaxis, hematuria or hematochezia. Denies recent chest pain on exertion, shortness of breath on minimal exertion, pre-syncopal episodes, or palpitations. Denies any numbness or tingling in hands or feet. Denies any recent fevers, infections, or recent hospitalizations. Patient reports appetite at 75% and energy level at 75%.     REVIEW OF SYSTEMS:  Review of Systems  Respiratory: Positive for cough.   All other systems reviewed and are negative.    PAST MEDICAL/SURGICAL HISTORY:  Past Medical History:  Diagnosis Date   Arthritis    Cancer [facility])    prostate   Family history of lung cancer    Family history of ovarian cancer    Hematuria    Nocturia    Past Surgical History:  Procedure Laterality Date   BACK SURGERY  1977   lower back   CHOLECYSTECTOMY  2000   LYMPHADENECTOMY Bilateral 06/24/2013   Procedure: LYMPHADENECTOMY;  Surgeon: Douglas Gray, MD;  Location: [facility];  Service: Urology;  Laterality: Bilateral;   MENISCUS REPAIR Right jan 2014   PROSTATE BIOPSY  Apr 26, 2013   ROBOT ASSISTED LAPAROSCOPIC RADICAL PROSTATECTOMY N/A 06/24/2013   Procedure: ROBOTIC ASSISTED LAPAROSCOPIC RADICAL PROSTATECTOMY LEVEL 2;  Surgeon: Douglas Gray, MD;  Location: [facility];  Service: Urology;  Laterality: N/A;     SOCIAL HISTORY:  Social History   Socioeconomic History   Marital status: Married    Spouse name: Not on file   Number of children: Not on file    Years of education: Not on file   Highest education level: Not on file  Occupational History   Not on file  Social Needs   Financial resource strain: Not on file   Food insecurity:    Worry: Not on file    Inability: Not on file   Transportation needs:    Medical: Not on file    Non-medical: Not on file  Tobacco Use   Smoking status: Former Smoker    Packs/day: 1.00    Types: Cigarettes    Last attempt to quit: 03/07/1978    Years since quitting: 40.2   Smokeless tobacco: Never Used  Substance and Sexual Activity   Alcohol use: Yes    Comment:  occasional   Drug use: No   Sexual activity: Not on file  Lifestyle   Physical activity:    Days per week: Not on file    Minutes per session: Not on file   Stress: Not on file  Relationships   Social connections:    Talks on phone: Not on file    Gets together: Not on file    Attends religious service: Not on file    Active member of club or organization: Not on file    Attends meetings of clubs or organizations: Not on file  Relationship status: Not on file   Intimate partner violence:    Fear of current or ex partner: Not on file    Emotionally abused: Not on file    Physically abused: Not on file    Forced sexual activity: Not on file  Other Topics Concern   Not on file  Social History Narrative   Not on file    FAMILY HISTORY:  Family History  Problem Relation Age of Onset   Deep vein thrombosis Mother    Heart failure Father    Ovarian cancer Sister 76   Lung cancer Brother 54   Cancer Maternal Aunt        NOS    CURRENT MEDICATIONS:  Outpatient Encounter Medications as of 05/23/2018  Medication Sig Note   apixaban (ELIQUIS) 5 MG TABS tablet Take 5 mg by mouth 2 (two) times daily.    CARBOPLATIN IV Inject into the vein every 21 ( twenty-one) days.    chlorproMAZINE (THORAZINE) 25 MG tablet Take 1 tablet (25 mg total) by mouth every 8 (eight) hours as needed.    ETOPOSIDE IV Inject  into the vein every 21 ( twenty-one) days.    fexofenadine (ALLEGRA) 180 MG tablet Take 180 mg by mouth daily. 06/17/2013: Stopped for procedure    furosemide (LASIX) 20 MG tablet     HYDROcodone-acetaminophen (NORCO) 5-325 MG per tablet Take 1-2 tablets by mouth every 6 (six) hours as needed.    hydroxypropyl methylcellulose (ISOPTO TEARS) 2.5 % ophthalmic solution Place 1 drop into both eyes 3 (three) times daily as needed for dry eyes.    magic mouthwash SOLN Take 5 mLs by mouth 4 (four) times daily.    metoprolol tartrate (LOPRESSOR) 25 MG tablet Take 25 mg by mouth 2 (two) times daily with a meal.    prochlorperazine (COMPAZINE) 10 MG tablet Take 1 tablet (10 mg total) by mouth every 6 (six) hours as needed (Nausea or vomiting).    zolpidem (AMBIEN) 10 MG tablet Take 1 tablet (10 mg total) by mouth at bedtime as needed. for sleep    [DISCONTINUED] chlorproMAZINE (THORAZINE) 25 MG tablet Take 1 tablet (25 mg total) by mouth every 8 (eight) hours as needed.    amoxicillin (AMOXIL) 500 MG tablet Take 1 tablet (500 mg total) by mouth 2 (two) times daily.    No facility-administered encounter medications on file as of 05/23/2018.     ALLERGIES:  Allergies  Allergen Reactions   Sulfa Antibiotics Nausea And Vomiting     PHYSICAL EXAM:  ECOG Performance status: 1  Vitals:   05/23/18 1052  BP: 123/70  Pulse: 95  Resp: 18  Temp: 98.7 F (37.1 C)  SpO2: 97%   Filed Weights   05/23/18 1052  Weight: 203 lb (92.1 kg)    Physical Exam Constitutional:      Appearance: Normal appearance.  Cardiovascular:     Rate and Rhythm: Normal rate and regular rhythm.  Pulmonary:     Effort: Pulmonary effort is normal.     Breath sounds: Normal breath sounds.  Abdominal:     General: Bowel sounds are normal. There is no distension.     Palpations: Abdomen is soft.  Musculoskeletal:        General: No swelling.  Skin:    General: Skin is warm.  Neurological:     General: No  focal deficit present.     Mental Status: He is alert and oriented to person, place, and time.  Psychiatric:  Mood and Affect: Mood normal.        Behavior: Behavior normal.      LABORATORY DATA:  I have reviewed the labs as listed.  CBC    Component Value Date/Time   WBC 12.9 (H) 05/18/2018 0843   RBC 5.50 05/18/2018 0843   HGB 15.8 05/18/2018 0843   HCT 48.7 05/18/2018 0843   PLT 204 05/18/2018 0843   MCV 88.5 05/18/2018 0843   MCH 28.7 05/18/2018 0843   MCHC 32.4 05/18/2018 0843   RDW 14.7 05/18/2018 0843   LYMPHSABS 1.7 05/18/2018 0843   MONOABS 1.1 (H) 05/18/2018 0843   EOSABS 0.1 05/18/2018 0843   BASOSABS 0.0 05/18/2018 0843   CMP Latest Ref Rng & Units 05/18/2018 02/15/2018 12/19/2017  Glucose 70 - 99 mg/dL 123(H) 100(H) 90  BUN 8 - 23 mg/dL 20 14 14   Creatinine 0.61 - 1.24 mg/dL 1.45(H) 1.24 1.37(H)  Sodium 135 - 145 mmol/L 136 136 137  Potassium 3.5 - 5.1 mmol/L 4.1 4.3 4.8  Chloride 98 - 111 mmol/L 104 103 105  CO2 22 - 32 mmol/L 24 26 26   Calcium 8.9 - 10.3 mg/dL 9.3 9.0 8.9  Total Protein 6.5 - 8.1 g/dL 7.5 7.1 7.0  Total Bilirubin 0.3 - 1.2 mg/dL 1.5(H) 1.0 0.9  Alkaline Phos 38 - 126 U/L 79 71 100  AST 15 - 41 U/L 18 22 22   ALT 0 - 44 U/L 16 20 20        DIAGNOSTIC IMAGING:  I have independently reviewed the scans and discussed with the patient.   I have reviewed Venita Lick LPN's note and agree with the documentation.  I personally performed a face-to-face visit, made revisions and my assessment and plan is as follows.    ASSESSMENT & PLAN:   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HCC) 1.  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 -PET CT scan on 07/27/2017 shows left lung mass with SUV of 14.6, probable metastatic left hilar lymph node, no other metastatic disease. - On 08/17/2017, underwent VATS left upper lobectomy, mediastinal  lymphadenectomy. - Pathology shows 2.6 x 2.1 x 2 cm large cell neuroendocrine carcinoma, 2/9 peribronchial lymph nodes positive, station 5, 6, 11 lymph nodes negative, negative visceral pleural involvement - CT of the brain on 10/16/2017 did not show any evidence of intracranial metastasis. - 4 cycles of adjuvant chemotherapy with carboplatin and VP-16 from 10/16/2017 through 12/19/2017. -CT of the chest dated 01/16/2018 shows changes of left upper lobectomy with small left pleural effusion. -His energy levels have improved since then. - He is having some hiccups for the past 2 days.  We will send a prescription for Thorazine as needed. -He does report symptoms of sinus infection.  We will send him a prescription for amoxicillin.  His white count is also elevated with left shift from infection. - We discussed the results of the CT of the chest with contrast dated 05/18/2018 which shows stable to slightly decreased size of the small chronic left pleural effusion.  No evidence of recurrence.  Indeterminate left adrenal nodule, stable compared to recent CT. - I have recommended follow-up with a repeat CT in 4 months.  If it remains stable then, we will switch him to 51-monthvisits.   2.  Family history: -He had a personal history of prostate cancer which was resected in 22015-03-03  Brother died of lung cancer and was a  smoker.  Sister died of ovarian cancer. -Genetic testing was negative.   3.  Atrial fibrillation: -He has a pacemaker.  He is continuing Eliquis twice daily without any bleeding complications.  4.  Insomnia: -He is taking Ambien 10 mg half tablet 2 times at night.  It is helping him to sleep.      Orders placed this encounter:  Orders Placed This Encounter  Procedures   CT Chest W Contrast   CBC with Differential/Platelet   Comprehensive metabolic panel      Derek Jack, MD [place] [phone]

## 2018-05-23 NOTE — Assessment & Plan Note (Signed)
1.  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 -PET CT scan on 07/27/2017 shows left lung mass with SUV of 14.6, probable metastatic left hilar lymph node, no other metastatic disease. - On 08/17/2017, underwent VATS left upper lobectomy, mediastinal lymphadenectomy. - Pathology shows 2.6 x 2.1 x 2 cm large cell neuroendocrine carcinoma, 2/9 peribronchial lymph nodes positive, station 5, 6, 11 lymph nodes negative, negative visceral pleural involvement - CT of the brain on 10/16/2017 did not show any evidence of intracranial metastasis. - 4 cycles of adjuvant chemotherapy with carboplatin and VP-16 from 10/16/2017 through 12/19/2017. -CT of the chest dated 01/16/2018 shows changes of left upper lobectomy with small left pleural effusion. -His energy levels have improved since then. - He is having some hiccups for the past 2 days.  We will send a prescription for Thorazine as needed. -He does report symptoms of sinus infection.  We will send him a prescription for amoxicillin.  His white count is also elevated with left shift from infection. - We discussed the results of the CT of the chest with contrast dated 05/18/2018 which shows stable to slightly decreased size of the small chronic left pleural effusion.  No evidence of recurrence.  Indeterminate left adrenal nodule, stable compared to recent CT. - I have recommended follow-up with a repeat CT in 4 months.  If it remains stable then, we will switch him to 80-month visits.   2.  Family history: -He had a personal history of prostate cancer which was resected in 05/17/13.  Brother died of lung cancer and was a smoker.  Sister died of ovarian cancer. -Genetic testing was negative.   3.  Atrial fibrillation: -He has a pacemaker.  He is continuing Eliquis twice daily without any bleeding complications.  4.  Insomnia: -He is taking Ambien 10 mg half  tablet 2 times at night.  It is helping him to sleep.

## 2018-07-02 ENCOUNTER — Other Ambulatory Visit (HOSPITAL_COMMUNITY): Payer: Self-pay | Admitting: *Deleted

## 2018-07-02 ENCOUNTER — Other Ambulatory Visit (HOSPITAL_COMMUNITY): Payer: Self-pay | Admitting: Hematology

## 2018-07-02 DIAGNOSIS — G479 Sleep disorder, unspecified: Secondary | ICD-10-CM

## 2018-09-21 ENCOUNTER — Inpatient Hospital Stay (HOSPITAL_COMMUNITY): Payer: Medicare HMO | Attending: Hematology

## 2018-09-21 ENCOUNTER — Encounter (HOSPITAL_COMMUNITY): Payer: Self-pay

## 2018-09-21 ENCOUNTER — Other Ambulatory Visit: Payer: Self-pay

## 2018-09-21 ENCOUNTER — Ambulatory Visit (HOSPITAL_COMMUNITY)
Admission: RE | Admit: 2018-09-21 | Discharge: 2018-09-21 | Disposition: A | Discharge status: Home / Self Care | Payer: Medicare HMO | Source: Ambulatory Visit | Attending: Hematology | Admitting: Hematology

## 2018-09-21 DIAGNOSIS — Z950 Presence of cardiac pacemaker: Secondary | ICD-10-CM | POA: Diagnosis not present

## 2018-09-21 DIAGNOSIS — Z87891 Personal history of nicotine dependence: Secondary | ICD-10-CM | POA: Insufficient documentation

## 2018-09-21 DIAGNOSIS — C7A8 Other malignant neuroendocrine tumors: Secondary | ICD-10-CM | POA: Insufficient documentation

## 2018-09-21 DIAGNOSIS — I4891 Unspecified atrial fibrillation: Secondary | ICD-10-CM | POA: Diagnosis not present

## 2018-09-21 DIAGNOSIS — E279 Disorder of adrenal gland, unspecified: Secondary | ICD-10-CM | POA: Diagnosis not present

## 2018-09-21 DIAGNOSIS — J90 Pleural effusion, not elsewhere classified: Secondary | ICD-10-CM | POA: Insufficient documentation

## 2018-09-21 DIAGNOSIS — Z7901 Long term (current) use of anticoagulants: Secondary | ICD-10-CM | POA: Diagnosis not present

## 2018-09-21 LAB — CBC WITH DIFFERENTIAL/PLATELET
Abs Immature Granulocytes: 0.02 10*3/uL (ref 0.00–0.07)
Basophils Absolute: 0 10*3/uL (ref 0.0–0.1)
Basophils Relative: 0 %
Eosinophils Absolute: 0.1 10*3/uL (ref 0.0–0.5)
Eosinophils Relative: 1 %
HCT: 50.5 % (ref 39.0–52.0)
Hemoglobin: 16.1 g/dL (ref 13.0–17.0)
Immature Granulocytes: 0 %
Lymphocytes Relative: 22 %
Lymphs Abs: 2.1 10*3/uL (ref 0.7–4.0)
MCH: 28.5 pg (ref 26.0–34.0)
MCHC: 31.9 g/dL (ref 30.0–36.0)
MCV: 89.5 fL (ref 80.0–100.0)
Monocytes Absolute: 0.6 10*3/uL (ref 0.1–1.0)
Monocytes Relative: 6 %
Neutro Abs: 6.5 10*3/uL (ref 1.7–7.7)
Neutrophils Relative %: 71 %
Platelets: 202 10*3/uL (ref 150–400)
RBC: 5.64 MIL/uL (ref 4.22–5.81)
RDW: 13.9 % (ref 11.5–15.5)
WBC: 9.3 10*3/uL (ref 4.0–10.5)
nRBC: 0 % (ref 0.0–0.2)

## 2018-09-21 LAB — COMPREHENSIVE METABOLIC PANEL
ALT: 15 U/L (ref 0–44)
AST: 17 U/L (ref 15–41)
Albumin: 4.3 g/dL (ref 3.5–5.0)
Alkaline Phosphatase: 82 U/L (ref 38–126)
Anion gap: 8 (ref 5–15)
BUN: 17 mg/dL (ref 8–23)
CO2: 27 mmol/L (ref 22–32)
Calcium: 9.4 mg/dL (ref 8.9–10.3)
Chloride: 102 mmol/L (ref 98–111)
Creatinine, Ser: 1.16 mg/dL (ref 0.61–1.24)
GFR calc Af Amer: >60 mL/min (ref >60)
GFR calc non Af Amer: >60 mL/min (ref >60)
Glucose, Bld: 103 mg/dL — ABNORMAL HIGH (ref 70–99)
Potassium: 4.7 mmol/L (ref 3.5–5.1)
Sodium: 137 mmol/L (ref 135–145)
Total Bilirubin: 0.8 mg/dL (ref 0.3–1.2)
Total Protein: 7.4 g/dL (ref 6.5–8.1)

## 2018-09-21 MED ORDER — IOHEXOL 300 MG/ML  SOLN
75.0000 mL | Freq: Once | INTRAMUSCULAR | Status: AC | PRN
  Administered 2018-09-21: 75 mL via INTRAVENOUS

## 2018-09-28 ENCOUNTER — Inpatient Hospital Stay (HOSPITAL_BASED_OUTPATIENT_CLINIC_OR_DEPARTMENT_OTHER): Payer: Medicare HMO | Admitting: Hematology

## 2018-09-28 ENCOUNTER — Other Ambulatory Visit: Payer: Self-pay

## 2018-09-28 ENCOUNTER — Other Ambulatory Visit (HOSPITAL_COMMUNITY): Payer: Self-pay | Admitting: *Deleted

## 2018-09-28 VITALS — BP 138/62 | HR 63 | Temp 97.8°F | Resp 18 | Wt 204.8 lb

## 2018-09-28 DIAGNOSIS — I4891 Unspecified atrial fibrillation: Secondary | ICD-10-CM | POA: Diagnosis not present

## 2018-09-28 DIAGNOSIS — E279 Disorder of adrenal gland, unspecified: Secondary | ICD-10-CM | POA: Diagnosis not present

## 2018-09-28 DIAGNOSIS — C7A8 Other malignant neuroendocrine tumors: Secondary | ICD-10-CM | POA: Diagnosis not present

## 2018-09-28 DIAGNOSIS — J90 Pleural effusion, not elsewhere classified: Secondary | ICD-10-CM | POA: Diagnosis not present

## 2018-09-28 DIAGNOSIS — Z950 Presence of cardiac pacemaker: Secondary | ICD-10-CM

## 2018-09-28 DIAGNOSIS — Z87891 Personal history of nicotine dependence: Secondary | ICD-10-CM

## 2018-09-28 DIAGNOSIS — Z7901 Long term (current) use of anticoagulants: Secondary | ICD-10-CM

## 2018-09-28 DIAGNOSIS — G479 Sleep disorder, unspecified: Secondary | ICD-10-CM

## 2018-09-28 MED ORDER — ZOLPIDEM TARTRATE 10 MG PO TABS: 10.0000 mg | ORAL_TABLET | Freq: Every evening | ORAL | 2 refills | Status: DC | PRN

## 2018-09-28 NOTE — Assessment & Plan Note (Signed)
1.  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 -PET CT scan on 07/27/2017 shows left lung mass with SUV of 14.6, probable metastatic left hilar lymph node, no other metastatic disease. - On 08/17/2017, underwent VATS left upper lobectomy, mediastinal lymphadenectomy. - Pathology shows 2.6 x 2.1 x 2 cm large cell neuroendocrine carcinoma, 2/9 peribronchial lymph nodes positive, station 5, 6, 11 lymph nodes negative, negative visceral pleural involvement - CT of the brain on 10/16/2017 did not show any evidence of intracranial metastasis. - 4 cycles of adjuvant chemotherapy with carboplatin and VP-16 from 10/16/2017 through 12/19/2017. -CT of the chest dated 01/16/2018 shows changes of left upper lobectomy with small left pleural effusion. -His energy levels have improved since then. - He is having some hiccups for the past 2 days.  We will send a prescription for Thorazine as needed. -He does report symptoms of sinus infection.  We will send him a prescription for amoxicillin.  His white count is also elevated with left shift from infection. - We discussed the results of the CT of the chest with contrast dated 05/18/2018 which shows stable to slightly decreased size of the small chronic left pleural effusion.  No evidence of recurrence.  Indeterminate left adrenal nodule, stable compared to recent CT. -CT chest on August 22, 2018 showed no evidence of disease.  There was stable small left pleural effusion.  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   - We will plan to repeat CT chest in 6 months.   2.  Family history: -He had a personal history of prostate cancer which was resected in 05-16-2013.  Brother died of lung cancer and was a smoker.  Sister died of ovarian cancer. -Genetic testing was negative.   3.  Atrial fibrillation: -He has a pacemaker.  He is continuing Eliquis twice  daily without any bleeding complications.  4.  Insomnia: -He is taking Ambien 10 mg half tablet 2 times at night.  It is helping him to sleep.

## 2018-09-28 NOTE — Progress Notes (Signed)
[place], [place] [address]   CLINIC:  Medical Oncology/Hematology  PCP:  [name], MD [address] [place] [address] [phone]   REASON FOR VISIT:  Follow-up for Large cell neuroendocrine carcinoma   CURRENT THERAPY: Clinical surveillance  BRIEF ONCOLOGIC HISTORY:  Oncology History  Prostate cancer ([facility])  06/24/2013 Initial Diagnosis   Prostate cancer ([place])   02/22/2018 Genetic Testing   Negative genetic testing on the common hereditary cancer panel.  The Hereditary Gene Panel offered by [facility] includes sequencing and/or deletion duplication testing of the following 47 genes: APC, ATM, AXIN2, BARD1, BMPR1A, BRCA1, BRCA2, BRIP1, CDH1, CDK4, CDKN2A (p14ARF), CDKN2A (p16INK4a), CHEK2, CTNNA1, DICER1, EPCAM (Deletion/duplication testing only), GREM1 (promoter region deletion/duplication testing only), KIT, MEN1, MLH1, MSH2, MSH3, MSH6, MUTYH, NBN, NF1, NHTL1, PALB2, PDGFRA, PMS2, POLD1, POLE, PTEN, RAD50, RAD51C, RAD51D, RET, SDHB, SDHC, SDHD, SMAD4, SMARCA4. STK11, TP53, TSC1, TSC2, and VHL.  The following genes were evaluated for sequence changes only: SDHA and HOXB13 c.251G>A variant only. The report date is February 22, 2018.   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[place])  07/27/2017 PET scan   Left lower lobe lung mass with SUV of 14.6, probable metastatic left hilar lymph node, no evidence of distant metastasis   08/17/2017 Surgery   VATS left upper lobe lobectomy, mediastinal lymphadenectomy, pathology consistent with 2.6 cm x 2.1 x 2 cm large cell neuroendocrine carcinoma, station 5, station 6, station 11 lymph nodes not involved, 2/9 peribronchial lymph nodes positive for large cell neuroendocrine carcinoma, negative visceral pleural involvement   10/03/2017 -  Chemotherapy   The patient had palonosetron (ALOXI) injection 0.25 mg, 0.25 mg, Intravenous,  Once, 4 of 4 cycles Administration: 0.25 mg (10/16/2017), 0.25 mg  (11/07/2017), 0.25 mg (11/28/2017), 0.25 mg (12/19/2017) pegfilgrastim (NEULASTA ONPRO KIT) injection 6 mg, 6 mg, Subcutaneous, Once, 3 of 3 cycles Administration: 6 mg (10/18/2017), 6 mg (11/09/2017), 6 mg (11/30/2017) pegfilgrastim-cbqv (UDENYCA) injection 6 mg, 6 mg, Subcutaneous, Once, 1 of 1 cycle CARBOplatin (PARAPLATIN) 500 mg in sodium chloride 0.9 % 250 mL chemo infusion, 500 mg (100 % of original dose 497 mg), Intravenous,  Once, 4 of 4 cycles Dose modification:   (original dose 497 mg, Cycle 1),   (original dose 467.5 mg, Cycle 2),   (original dose 470 mg, Cycle 3),   (original dose 437.5 mg, Cycle 4) Administration: 500 mg (10/16/2017), 470 mg (11/07/2017), 470 mg (11/28/2017), 440 mg (12/19/2017) etoposide (VEPESID) 200 mg in sodium chloride 0.9 % 500 mL chemo infusion, 90 mg/m2 = 220 mg, Intravenous,  Once, 4 of 4 cycles Administration: 200 mg (10/16/2017), 200 mg (10/17/2017), 200 mg (10/18/2017), 200 mg (11/07/2017), 200 mg (11/08/2017), 200 mg (11/09/2017), 200 mg (11/28/2017), 200 mg (11/29/2017), 200 mg (11/30/2017), 200 mg (12/19/2017), 200 mg (12/20/2017), 200 mg (12/21/2017) fosaprepitant (EMEND) 150 mg, dexamethasone (DECADRON) 12 mg in sodium chloride 0.9 % 145 mL IVPB, , Intravenous,  Once, 4 of 4 cycles Administration:  (10/16/2017),  (11/07/2017),  (11/28/2017),  (12/19/2017)  for chemotherapy treatment.    02/22/2018 Genetic Testing   Negative genetic testing on the common hereditary cancer panel.  The Hereditary Gene Panel offered by [facility] includes sequencing and/or deletion duplication testing of the following 47 genes: APC, ATM, AXIN2, BARD1, BMPR1A, BRCA1, BRCA2, BRIP1, CDH1, CDK4, CDKN2A (p14ARF), CDKN2A (p16INK4a), CHEK2, CTNNA1, DICER1, EPCAM (Deletion/duplication testing only), GREM1 (promoter region deletion/duplication testing only), KIT, MEN1, MLH1, MSH2, MSH3, MSH6, MUTYH, NBN, NF1, NHTL1, PALB2, PDGFRA, PMS2, POLD1, POLE, PTEN,  RAD50, RAD51C, RAD51D, RET, SDHB, SDHC, SDHD, SMAD4,  SMARCA4. STK11, TP53, TSC1, TSC2, and VHL.  The following genes were evaluated for sequence changes only: SDHA and HOXB13 c.251G>A variant only. The report date is February 22, 2018.       INTERVAL HISTORY:  Douglas Baxter 78 y.o. male presents today for follow-up.  Reports overall doing well.  He denies any significant fatigue.  Denies any chest pain or shortness of breath.  He denies any new cough.  He reports appetite and energy levels are stable.  He denies any recent infections or hospitalizations.  He is here to discuss recent CT results.    REVIEW OF SYSTEMS:  Review of Systems  All other systems reviewed and are negative.    PAST MEDICAL/SURGICAL HISTORY:  Past Medical History:  Diagnosis Date   Arthritis    Cancer [facility])    prostate   Family history of lung cancer    Family history of ovarian cancer    Hematuria    Nocturia    Past Surgical History:  Procedure Laterality Date   BACK SURGERY  1977   lower back   CHOLECYSTECTOMY  2000   LYMPHADENECTOMY Bilateral 06/24/2013   Procedure: LYMPHADENECTOMY;  Surgeon: [name], MD;  Location: [facility];  Service: Urology;  Laterality: Bilateral;   MENISCUS REPAIR Right jan 2014   PROSTATE BIOPSY  Apr 26, 2013   ROBOT ASSISTED LAPAROSCOPIC RADICAL PROSTATECTOMY N/A 06/24/2013   Procedure: ROBOTIC ASSISTED LAPAROSCOPIC RADICAL PROSTATECTOMY LEVEL 2;  Surgeon: [name], MD;  Location: [facility];  Service: Urology;  Laterality: N/A;     SOCIAL HISTORY:  Social History   Socioeconomic History   Marital status: Married    Spouse name: Not on file   Number of children: Not on file   Years of education: Not on file   Highest education level: Not on file  Occupational History   Not on file  Social Needs   Financial resource strain: Not on file   Food insecurity    Worry: Not on file    Inability: Not on file   Transportation needs    Medical: Not on file    Non-medical: Not on file  Tobacco Use    Smoking status: Former Smoker    Packs/day: 1.00    Types: Cigarettes    Quit date: 03/07/1978    Years since quitting: 40.5   Smokeless tobacco: Never Used  Substance and Sexual Activity   Alcohol use: Yes    Comment:  occasional   Drug use: No   Sexual activity: Not on file  Lifestyle   Physical activity    Days per week: Not on file    Minutes per session: Not on file   Stress: Not on file  Relationships   Social connections    Talks on phone: Not on file    Gets together: Not on file    Attends religious service: Not on file    Active member of club or organization: Not on file    Attends meetings of clubs or organizations: Not on file    Relationship status: Not on file   Intimate partner violence    Fear of current or ex partner: Not on file    Emotionally abused: Not on file    Physically abused: Not on file    Forced sexual activity: Not on file  Other Topics Concern   Not on file  Social History Narrative   Not on file  FAMILY HISTORY:  Family History  Problem Relation Age of Onset   Deep vein thrombosis Mother    Heart failure Father    Ovarian cancer Sister 74   Lung cancer Brother 24   Cancer Maternal Aunt        NOS    CURRENT MEDICATIONS:  Outpatient Encounter Medications as of 09/28/2018  Medication Sig Note   apixaban (ELIQUIS) 5 MG TABS tablet Take 5 mg by mouth 2 (two) times daily.    furosemide (LASIX) 20 MG tablet     metoprolol tartrate (LOPRESSOR) 25 MG tablet Take 25 mg by mouth 2 (two) times daily with a meal.    [DISCONTINUED] zolpidem (AMBIEN) 10 MG tablet TAKE 1 TABLET BY MOUTH AT BEDTIME AS NEEDED FOR SLEEP    CARBOPLATIN IV Inject into the vein every 21 ( twenty-one) days.    chlorproMAZINE (THORAZINE) 25 MG tablet Take 1 tablet (25 mg total) by mouth every 8 (eight) hours as needed. (Patient not taking: Reported on 09/28/2018)    ETOPOSIDE IV Inject into the vein every 21 ( twenty-one) days.    fexofenadine  (ALLEGRA) 180 MG tablet Take 180 mg by mouth daily. 06/17/2013: Stopped for procedure    HYDROcodone-acetaminophen (NORCO) 5-325 MG per tablet Take 1-2 tablets by mouth every 6 (six) hours as needed. (Patient not taking: Reported on 09/28/2018)    hydroxypropyl methylcellulose (ISOPTO TEARS) 2.5 % ophthalmic solution Place 1 drop into both eyes 3 (three) times daily as needed for dry eyes.    magic mouthwash SOLN Take 5 mLs by mouth 4 (four) times daily. (Patient not taking: Reported on 09/28/2018)    Potassium Chloride ER 20 MEQ TBCR Take 1 tablet by mouth daily. with food    prochlorperazine (COMPAZINE) 10 MG tablet Take 1 tablet (10 mg total) by mouth every 6 (six) hours as needed (Nausea or vomiting). (Patient not taking: Reported on 09/28/2018)    [DISCONTINUED] amoxicillin (AMOXIL) 500 MG tablet Take 1 tablet (500 mg total) by mouth 2 (two) times daily.    No facility-administered encounter medications on file as of 09/28/2018.     ALLERGIES:  Allergies  Allergen Reactions   Sulfa Antibiotics Nausea And Vomiting     PHYSICAL EXAM:  ECOG Performance status: 1  Vitals:   09/28/18 1030  BP: 138/62  Pulse: 63  Resp: 18  Temp: 97.8 F (36.6 C)  SpO2: 100%   Filed Weights   09/28/18 1030  Weight: 204 lb 12.8 oz (92.9 kg)    Physical Exam Constitutional:      Appearance: Normal appearance.  HENT:     Head: Normocephalic.     Nose: Nose normal.     Mouth/Throat:     Mouth: Mucous membranes are moist.     Pharynx: Oropharynx is clear.  Eyes:     Extraocular Movements: Extraocular movements intact.     Conjunctiva/sclera: Conjunctivae normal.  Neck:     Musculoskeletal: Normal range of motion.  Cardiovascular:     Rate and Rhythm: Normal rate and regular rhythm.     Pulses: Normal pulses.     Heart sounds: Normal heart sounds.  Pulmonary:     Effort: Pulmonary effort is normal.     Breath sounds: Normal breath sounds.  Abdominal:     General: Bowel sounds are  normal.     Palpations: Abdomen is soft.  Musculoskeletal: Normal range of motion.  Skin:    General: Skin is warm and dry.  Neurological:  General: No focal deficit present.     Mental Status: He is alert and oriented to person, place, and time.  Psychiatric:        Mood and Affect: Mood normal.        Behavior: Behavior normal.        Thought Content: Thought content normal.        Judgment: Judgment normal.      LABORATORY DATA:  I have reviewed the labs as listed.  CBC    Component Value Date/Time   WBC 9.3 09/21/2018 0901   RBC 5.64 09/21/2018 0901   HGB 16.1 09/21/2018 0901   HCT 50.5 09/21/2018 0901   PLT 202 09/21/2018 0901   MCV 89.5 09/21/2018 0901   MCH 28.5 09/21/2018 0901   MCHC 31.9 09/21/2018 0901   RDW 13.9 09/21/2018 0901   LYMPHSABS 2.1 09/21/2018 0901   MONOABS 0.6 09/21/2018 0901   EOSABS 0.1 09/21/2018 0901   BASOSABS 0.0 09/21/2018 0901   CMP Latest Ref Rng & Units 09/21/2018 05/18/2018 02/15/2018  Glucose 70 - 99 mg/dL 103(H) 123(H) 100(H)  BUN 8 - 23 mg/dL 17 20 14   Creatinine 0.61 - 1.24 mg/dL 1.16 1.45(H) 1.24  Sodium 135 - 145 mmol/L 137 136 136  Potassium 3.5 - 5.1 mmol/L 4.7 4.1 4.3  Chloride 98 - 111 mmol/L 102 104 103  CO2 22 - 32 mmol/L 27 24 26   Calcium 8.9 - 10.3 mg/dL 9.4 9.3 9.0  Total Protein 6.5 - 8.1 g/dL 7.4 7.5 7.1  Total Bilirubin 0.3 - 1.2 mg/dL 0.8 1.5(H) 1.0  Alkaline Phos 38 - 126 U/L 82 79 71  AST 15 - 41 U/L 17 18 22   ALT 0 - 44 U/L 15 16 20       ASSESSMENT & PLAN:   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HCC) 1.  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 -PET CT scan on 07/27/2017 shows left lung mass with SUV of 14.6, probable metastatic left hilar lymph node, no other metastatic disease. - On 08/17/2017, underwent VATS left upper lobectomy, mediastinal lymphadenectomy. - Pathology shows 2.6 x 2.1 x 2 cm  large cell neuroendocrine carcinoma, 2/9 peribronchial lymph nodes positive, station 5, 6, 11 lymph nodes negative, negative visceral pleural involvement - CT of the brain on 10/16/2017 did not show any evidence of intracranial metastasis. - 4 cycles of adjuvant chemotherapy with carboplatin and VP-16 from 10/16/2017 through 12/19/2017. -CT of the chest dated 01/16/2018 shows changes of left upper lobectomy with small left pleural effusion. -His energy levels have improved since then. - He is having some hiccups for the past 2 days.  We will send a prescription for Thorazine as needed. -He does report symptoms of sinus infection.  We will send him a prescription for amoxicillin.  His white count is also elevated with left shift from infection. - We discussed the results of the CT of the chest with contrast dated 05/18/2018 which shows stable to slightly decreased size of the small chronic left pleural effusion.  No evidence of recurrence.  Indeterminate left adrenal nodule, stable compared to recent CT. -CT chest on August 22, 2018 showed no evidence of disease.  There was stable small left pleural effusion.  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   - We will plan to repeat CT chest in 6 months.   2.  Family history: -He had a  personal history of prostate cancer which was resected in 2013/05/21.  Brother died of lung cancer and was a smoker.  Sister died of ovarian cancer. -Genetic testing was negative.   3.  Atrial fibrillation: -He has a pacemaker.  He is continuing Eliquis twice daily without any bleeding complications.  4.  Insomnia: -He is taking Ambien 10 mg half tablet 2 times at night.  It is helping him to sleep.      Orders placed this encounter:  Orders Placed This Encounter  Procedures   CT Chest W Contrast   CBC with Differential   Comprehensive metabolic panel      Roger Shelter, [place] [phone]

## 2018-09-30 ENCOUNTER — Other Ambulatory Visit (HOSPITAL_COMMUNITY): Payer: Self-pay | Admitting: Hematology

## 2018-09-30 DIAGNOSIS — G479 Sleep disorder, unspecified: Secondary | ICD-10-CM

## 2019-03-28 ENCOUNTER — Telehealth (HOSPITAL_COMMUNITY): Payer: Self-pay | Admitting: *Deleted

## 2019-03-28 ENCOUNTER — Other Ambulatory Visit (HOSPITAL_COMMUNITY): Payer: Self-pay | Admitting: *Deleted

## 2019-03-28 DIAGNOSIS — G479 Sleep disorder, unspecified: Secondary | ICD-10-CM

## 2019-03-28 MED ORDER — ZOLPIDEM TARTRATE 10 MG PO TABS: ORAL_TABLET | ORAL | 5 refills | Status: AC

## 2019-03-29 ENCOUNTER — Other Ambulatory Visit (HOSPITAL_COMMUNITY): Payer: Self-pay

## 2019-03-29 DIAGNOSIS — C7A8 Other malignant neuroendocrine tumors: Secondary | ICD-10-CM

## 2019-03-29 DIAGNOSIS — C7A1 Malignant poorly differentiated neuroendocrine tumors: Secondary | ICD-10-CM

## 2019-03-29 DIAGNOSIS — C3490 Malignant neoplasm of unspecified part of unspecified bronchus or lung: Secondary | ICD-10-CM

## 2019-03-29 DIAGNOSIS — C61 Malignant neoplasm of prostate: Secondary | ICD-10-CM

## 2019-04-01 ENCOUNTER — Inpatient Hospital Stay (HOSPITAL_COMMUNITY): Payer: Medicare Other | Attending: Hematology

## 2019-04-01 ENCOUNTER — Other Ambulatory Visit: Payer: Self-pay

## 2019-04-01 ENCOUNTER — Ambulatory Visit (HOSPITAL_COMMUNITY)
Admission: RE | Admit: 2019-04-01 | Discharge: 2019-04-01 | Disposition: A | Discharge status: Home / Self Care | Payer: Medicare Other | Source: Ambulatory Visit | Attending: Hematology | Admitting: Hematology

## 2019-04-01 DIAGNOSIS — C7A8 Other malignant neuroendocrine tumors: Secondary | ICD-10-CM | POA: Diagnosis not present

## 2019-04-01 DIAGNOSIS — C61 Malignant neoplasm of prostate: Secondary | ICD-10-CM

## 2019-04-01 DIAGNOSIS — C3490 Malignant neoplasm of unspecified part of unspecified bronchus or lung: Secondary | ICD-10-CM

## 2019-04-01 LAB — COMPREHENSIVE METABOLIC PANEL
ALT: 16 U/L (ref 0–44)
AST: 20 U/L (ref 15–41)
Albumin: 4.4 g/dL (ref 3.5–5.0)
Alkaline Phosphatase: 78 U/L (ref 38–126)
Anion gap: 8 (ref 5–15)
BUN: 18 mg/dL (ref 8–23)
CO2: 28 mmol/L (ref 22–32)
Calcium: 9.3 mg/dL (ref 8.9–10.3)
Chloride: 99 mmol/L (ref 98–111)
Creatinine, Ser: 1.21 mg/dL (ref 0.61–1.24)
GFR calc Af Amer: >60 mL/min (ref >60)
GFR calc non Af Amer: 58 mL/min — ABNORMAL LOW (ref >60)
Glucose, Bld: 100 mg/dL — ABNORMAL HIGH (ref 70–99)
Potassium: 4.7 mmol/L (ref 3.5–5.1)
Sodium: 135 mmol/L (ref 135–145)
Total Bilirubin: 1.3 mg/dL — ABNORMAL HIGH (ref 0.3–1.2)
Total Protein: 7.2 g/dL (ref 6.5–8.1)

## 2019-04-01 LAB — CBC WITH DIFFERENTIAL/PLATELET
Abs Immature Granulocytes: 0.03 10*3/uL (ref 0.00–0.07)
Basophils Absolute: 0 10*3/uL (ref 0.0–0.1)
Basophils Relative: 0 %
Eosinophils Absolute: 0.1 10*3/uL (ref 0.0–0.5)
Eosinophils Relative: 1 %
HCT: 48.6 % (ref 39.0–52.0)
Hemoglobin: 15.9 g/dL (ref 13.0–17.0)
Immature Granulocytes: 0 %
Lymphocytes Relative: 23 %
Lymphs Abs: 2 10*3/uL (ref 0.7–4.0)
MCH: 29.6 pg (ref 26.0–34.0)
MCHC: 32.7 g/dL (ref 30.0–36.0)
MCV: 90.3 fL (ref 80.0–100.0)
Monocytes Absolute: 0.7 10*3/uL (ref 0.1–1.0)
Monocytes Relative: 7 %
Neutro Abs: 6.1 10*3/uL (ref 1.7–7.7)
Neutrophils Relative %: 69 %
Platelets: 232 10*3/uL (ref 150–400)
RBC: 5.38 MIL/uL (ref 4.22–5.81)
RDW: 14.1 % (ref 11.5–15.5)
WBC: 8.9 10*3/uL (ref 4.0–10.5)
nRBC: 0 % (ref 0.0–0.2)

## 2019-04-01 MED ORDER — IOHEXOL 300 MG/ML  SOLN
75.0000 mL | Freq: Once | INTRAMUSCULAR | Status: AC | PRN
  Administered 2019-04-01: 75 mL via INTRAVENOUS

## 2019-04-04 ENCOUNTER — Inpatient Hospital Stay (HOSPITAL_BASED_OUTPATIENT_CLINIC_OR_DEPARTMENT_OTHER): Payer: Medicare Other | Admitting: Hematology

## 2019-04-04 ENCOUNTER — Encounter (HOSPITAL_COMMUNITY): Payer: Self-pay | Admitting: Hematology

## 2019-04-04 DIAGNOSIS — C7A8 Other malignant neuroendocrine tumors: Secondary | ICD-10-CM | POA: Diagnosis not present

## 2019-04-04 NOTE — Progress Notes (Signed)
Virtual Visit via Telephone Note  I connected with Douglas Baxter on 04/04/19 at  4:05 PM EST by telephone and verified that I am speaking with the correct person using two identifiers.   I discussed the limitations, risks, security and privacy concerns of performing an evaluation and management service by telephone and the availability of in person appointments. I also discussed with the patient that there may be a patient responsible charge related to this service. The patient expressed understanding and agreed to proceed.   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  He underwent VATS left upper lobectomy and mediastinal lymphadenectomy on 08/17/2017 with pathology showing 2.6 x 2.1 x 2 cm large cell neuroendocrine carcinoma, 2/9 peribronchial lymph nodes positive, negative visceral pleural involvement.  He received 4 cycles of adjuvant chemotherapy with carboplatin and VP-16 from 10/16/2017 through 12/19/2017.   Observations/Objective: He reports that his energy levels have moderately improved.  Denies any cough or hemoptysis.  He reports mild shortness of breath on exertion.  Appetite is 100%.  Energy levels are 50%.  Denies any fevers, night sweats or weight loss in the last 6 months.  Assessment and Plan:  1.  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 -We reviewed results of the CT of the chest with contrast on 04/01/2019.  No findings to suggest recurrent or metastatic disease.  Cardiomegaly noted.  Calcifications in the aortic valve noted.  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 -We have also reviewed his blood work.  CBC was within normal limits.  Total bilirubin is mildly elevated at 1.3.  This was slightly higher on previous  visits also and stable. -We will schedule him for 4-month follow-up with CT scan of the chest and labs.  2.  Family history: -Patient had personal history of prostate cancer which was resected in 2013/06/03. -Brother died of lung cancer in a smoker.  Sister died of ovarian cancer. -Genetic testing was negative.  3.  Insomnia: -He is taking Ambien 10 mg half tablet at bedtime as needed.  4.  Atrial fibrillation: -He has a pacemaker.  He is continuing Eliquis twice daily.   Follow Up Instructions: RTC 6 months with labs and scan.   I discussed the assessment and treatment plan with the patient. The patient was provided an opportunity to ask questions and all were answered. The patient agreed with the plan and demonstrated an understanding of the instructions.   The patient was advised to call back or seek an in-person evaluation if the symptoms worsen or if the condition fails to improve as anticipated.  I provided 12 minutes of non-face-to-face time during this encounter.   Derek Jack, MD

## 2019-06-13 ENCOUNTER — Encounter (HOSPITAL_COMMUNITY): Payer: Self-pay | Admitting: *Deleted

## 2019-06-13 NOTE — Progress Notes (Signed)
Patient called and requested that we fax his last CT scan to Dr. Luiz Ochoa his cardiologist.  Results faxed to [phone].

## 2019-06-20 ENCOUNTER — Inpatient Hospital Stay (HOSPITAL_COMMUNITY): Payer: Medicare HMO | Attending: Hematology | Admitting: Hematology

## 2019-06-20 ENCOUNTER — Encounter (HOSPITAL_COMMUNITY): Payer: Self-pay | Admitting: Hematology

## 2019-06-20 ENCOUNTER — Other Ambulatory Visit: Payer: Self-pay

## 2019-06-20 VITALS — BP 154/65 | HR 68 | Temp 96.9°F | Resp 18 | Wt 191.4 lb

## 2019-06-20 DIAGNOSIS — Z87891 Personal history of nicotine dependence: Secondary | ICD-10-CM | POA: Insufficient documentation

## 2019-06-20 DIAGNOSIS — Z8041 Family history of malignant neoplasm of ovary: Secondary | ICD-10-CM | POA: Insufficient documentation

## 2019-06-20 DIAGNOSIS — R599 Enlarged lymph nodes, unspecified: Secondary | ICD-10-CM | POA: Diagnosis not present

## 2019-06-20 DIAGNOSIS — R634 Abnormal weight loss: Secondary | ICD-10-CM | POA: Diagnosis not present

## 2019-06-20 DIAGNOSIS — M899 Disorder of bone, unspecified: Secondary | ICD-10-CM | POA: Diagnosis not present

## 2019-06-20 DIAGNOSIS — J90 Pleural effusion, not elsewhere classified: Secondary | ICD-10-CM | POA: Diagnosis not present

## 2019-06-20 DIAGNOSIS — K769 Liver disease, unspecified: Secondary | ICD-10-CM | POA: Insufficient documentation

## 2019-06-20 DIAGNOSIS — K5900 Constipation, unspecified: Secondary | ICD-10-CM | POA: Insufficient documentation

## 2019-06-20 DIAGNOSIS — R630 Anorexia: Secondary | ICD-10-CM | POA: Insufficient documentation

## 2019-06-20 DIAGNOSIS — Z7901 Long term (current) use of anticoagulants: Secondary | ICD-10-CM | POA: Insufficient documentation

## 2019-06-20 DIAGNOSIS — R918 Other nonspecific abnormal finding of lung field: Secondary | ICD-10-CM | POA: Insufficient documentation

## 2019-06-20 DIAGNOSIS — I4891 Unspecified atrial fibrillation: Secondary | ICD-10-CM | POA: Insufficient documentation

## 2019-06-20 DIAGNOSIS — M25511 Pain in right shoulder: Secondary | ICD-10-CM | POA: Diagnosis not present

## 2019-06-20 DIAGNOSIS — R0781 Pleurodynia: Secondary | ICD-10-CM | POA: Diagnosis not present

## 2019-06-20 DIAGNOSIS — M545 Low back pain: Secondary | ICD-10-CM | POA: Insufficient documentation

## 2019-06-20 DIAGNOSIS — G4700 Insomnia, unspecified: Secondary | ICD-10-CM | POA: Diagnosis not present

## 2019-06-20 DIAGNOSIS — R0609 Other forms of dyspnea: Secondary | ICD-10-CM | POA: Diagnosis not present

## 2019-06-20 DIAGNOSIS — C7A8 Other malignant neuroendocrine tumors: Secondary | ICD-10-CM | POA: Diagnosis present

## 2019-06-20 DIAGNOSIS — Z950 Presence of cardiac pacemaker: Secondary | ICD-10-CM | POA: Insufficient documentation

## 2019-06-20 DIAGNOSIS — Z801 Family history of malignant neoplasm of trachea, bronchus and lung: Secondary | ICD-10-CM | POA: Diagnosis not present

## 2019-06-20 NOTE — Patient Instructions (Addendum)
[place] at [facility] Discharge Instructions  You were seen today by Dr. Delton Coombes. He went over your recent lab and scan results. He will schedule you for a PET scan to further evaluate what's going on. He will see you back after your scan for follow up.   Thank you for choosing [place] at [facility] to provide your oncology and hematology care.  To afford each patient quality time with our provider, please arrive at least 15 minutes before your scheduled appointment time.   If you have a lab appointment with the [facility] please come in thru the  Main Entrance and check in at the main information desk  You need to re-schedule your appointment should you arrive 10 or more minutes late.  We strive to give you quality time with our providers, and arriving late affects you and other patients whose appointments are after yours.  Also, if you no show three or more times for appointments you may be dismissed from the clinic at the providers discretion.     Again, thank you for choosing [facility].  Our hope is that these requests will decrease the amount of time that you wait before being seen by our physicians.       _____________________________________________________________  Should you have questions after your visit to [facility], please contact our office at [phone] between the hours of 8:00 a.m. and 4:30 p.m.  Voicemails left after 4:00 p.m. will not be returned until the following business day.  For prescription refill requests, have your pharmacy contact our office and allow 72 hours.    Cancer Center Support Programs:   > Cancer Support Group  2nd Tuesday of the month 1pm-2pm, Journey Room

## 2019-06-20 NOTE — Progress Notes (Signed)
Douglas Baxter, [place] [address]   CLINIC:  Medical Oncology/Hematology  PCP:  Frederic Jericho, NP [address] [place] [address] [phone]   REASON FOR VISIT:  Follow-up for Large cell neuroendocrine carcinoma   CURRENT THERAPY: Clinical surveillance  BRIEF ONCOLOGIC HISTORY:  Oncology History  Prostate cancer ([place])  06/24/2013 Initial Diagnosis   Prostate cancer ([place])   02/22/2018 Genetic Testing   Negative genetic testing on the common hereditary cancer panel.  The Hereditary Gene Panel offered by [facility] includes sequencing and/or deletion duplication testing of the following 47 genes: APC, ATM, AXIN2, BARD1, BMPR1A, BRCA1, BRCA2, BRIP1, CDH1, CDK4, CDKN2A (p14ARF), CDKN2A (p16INK4a), CHEK2, CTNNA1, DICER1, EPCAM (Deletion/duplication testing only), GREM1 (promoter region deletion/duplication testing only), KIT, MEN1, MLH1, MSH2, MSH3, MSH6, MUTYH, NBN, NF1, NHTL1, PALB2, PDGFRA, PMS2, POLD1, POLE, PTEN, RAD50, RAD51C, RAD51D, RET, SDHB, SDHC, SDHD, SMAD4, SMARCA4. STK11, TP53, TSC1, TSC2, and VHL.  The following genes were evaluated for sequence changes only: SDHA and HOXB13 c.251G>A variant only. The report date is February 22, 2018.   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[place])  07/27/2017 PET scan   Left lower lobe lung mass with SUV of 14.6, probable metastatic left hilar lymph node, no evidence of distant metastasis   08/17/2017 Surgery   VATS left upper lobe lobectomy, mediastinal lymphadenectomy, pathology consistent with 2.6 cm x 2.1 x 2 cm large cell neuroendocrine carcinoma, station 5, station 6, station 11 lymph nodes not involved, 2/9 peribronchial lymph nodes positive for large cell neuroendocrine carcinoma, negative visceral pleural involvement   10/03/2017 -  Chemotherapy   The patient had palonosetron (ALOXI) injection 0.25 mg, 0.25 mg, Intravenous,  Once, 4 of 4 cycles Administration: 0.25 mg (10/16/2017), 0.25 mg  (11/07/2017), 0.25 mg (11/28/2017), 0.25 mg (12/19/2017) pegfilgrastim (NEULASTA ONPRO KIT) injection 6 mg, 6 mg, Subcutaneous, Once, 3 of 3 cycles Administration: 6 mg (10/18/2017), 6 mg (11/09/2017), 6 mg (11/30/2017) pegfilgrastim-cbqv (UDENYCA) injection 6 mg, 6 mg, Subcutaneous, Once, 1 of 1 cycle CARBOplatin (PARAPLATIN) 500 mg in sodium chloride 0.9 % 250 mL chemo infusion, 500 mg (100 % of original dose 497 mg), Intravenous,  Once, 4 of 4 cycles Dose modification:   (original dose 497 mg, Cycle 1),   (original dose 467.5 mg, Cycle 2),   (original dose 470 mg, Cycle 3),   (original dose 437.5 mg, Cycle 4) Administration: 500 mg (10/16/2017), 470 mg (11/07/2017), 470 mg (11/28/2017), 440 mg (12/19/2017) etoposide (VEPESID) 200 mg in sodium chloride 0.9 % 500 mL chemo infusion, 90 mg/m2 = 220 mg, Intravenous,  Once, 4 of 4 cycles Administration: 200 mg (10/16/2017), 200 mg (10/17/2017), 200 mg (10/18/2017), 200 mg (11/07/2017), 200 mg (11/08/2017), 200 mg (11/09/2017), 200 mg (11/28/2017), 200 mg (11/29/2017), 200 mg (11/30/2017), 200 mg (12/19/2017), 200 mg (12/20/2017), 200 mg (12/21/2017) fosaprepitant (EMEND) 150 mg, dexamethasone (DECADRON) 12 mg in sodium chloride 0.9 % 145 mL IVPB, , Intravenous,  Once, 4 of 4 cycles Administration:  (10/16/2017),  (11/07/2017),  (11/28/2017),  (12/19/2017)  for chemotherapy treatment.    02/22/2018 Genetic Testing   Negative genetic testing on the common hereditary cancer panel.  The Hereditary Gene Panel offered by [facility] includes sequencing and/or deletion duplication testing of the following 47 genes: APC, ATM, AXIN2, BARD1, BMPR1A, BRCA1, BRCA2, BRIP1, CDH1, CDK4, CDKN2A (p14ARF), CDKN2A (p16INK4a), CHEK2, CTNNA1, DICER1, EPCAM (Deletion/duplication testing only), GREM1 (promoter region deletion/duplication testing only), KIT, MEN1, MLH1, MSH2, MSH3, MSH6, MUTYH, NBN, NF1, NHTL1, PALB2, PDGFRA, PMS2,  POLD1, POLE, PTEN, RAD50, RAD51C, RAD51D, RET, SDHB, SDHC, SDHD, SMAD4,  SMARCA4. STK11, TP53, TSC1, TSC2, and VHL.  The following genes were evaluated for sequence changes only: SDHA and HOXB13 c.251G>A variant only. The report date is February 22, 2018.       INTERVAL HISTORY:  Douglas Baxter 79 y.o. male seen on an unscheduled visit today.  He apparently developed right lower back pain, right shoulder pain and right-sided rib pain for the past 2 months.  He was seeing a Restaurant manager, fast food in [place].  No improvement in pain.  He was taking Tylenol 650 mg 2-3 times a day.  He went to the ER in [place] on 06/19/2019.  A CT scan of the chest with contrast was done.  Was negative for pulmonary embolism.  Other findings were seen.  He reports decrease in appetite secondary to pain.  He lost about 7 pounds in the last 3 months.  Reports shortness of breath on exertion.    REVIEW OF SYSTEMS:  Review of Systems  Respiratory: Positive for shortness of breath.   Musculoskeletal: Positive for back pain.  All other systems reviewed and are negative.    PAST MEDICAL/SURGICAL HISTORY:  Past Medical History:  Diagnosis Date  . Arthritis   . Cancer [facility])    prostate  . Family history of lung cancer   . Family history of ovarian cancer   . Hematuria   . Nocturia    Past Surgical History:  Procedure Laterality Date  . [place]   lower back  . CHOLECYSTECTOMY  2000  . LYMPHADENECTOMY Bilateral 06/24/2013   Procedure: LYMPHADENECTOMY;  Surgeon: [name], MD;  Location: [facility];  Service: Urology;  Laterality: Bilateral;  . MENISCUS REPAIR Right jan 2014  . PROSTATE BIOPSY  Apr 26, 2013  . ROBOT ASSISTED LAPAROSCOPIC RADICAL PROSTATECTOMY N/A 06/24/2013   Procedure: ROBOTIC ASSISTED LAPAROSCOPIC RADICAL PROSTATECTOMY LEVEL 2;  Surgeon: [name], MD;  Location: [facility];  Service: Urology;  Laterality: N/A;     SOCIAL HISTORY:  Social History   Socioeconomic History  . Marital status: Married    Spouse name: Not on file  . Number of children: Not on  file  . Years of education: Not on file  . Highest education level: Not on file  Occupational History  . Not on file  Tobacco Use  . Smoking status: Former Smoker    Packs/day: 1.00    Types: Cigarettes    Quit date: 03/07/1978    Years since quitting: 41.3  . Smokeless tobacco: Never Used  Substance and Sexual Activity  . Alcohol use: Yes    Comment:  occasional  . Drug use: No  . Sexual activity: Not on file  Other Topics Concern  . Not on file  Social History Narrative  . Not on file   Social Determinants of Health   Financial Resource Strain:   . Difficulty of Paying Living Expenses:   Food Insecurity:   . Worried About Charity fundraiser in the Last Year:   . Arboriculturist in the Last Year:   Transportation Needs:   . Film/video editor (Medical):   Marland Kitchen Lack of Transportation (Non-Medical):   Physical Activity:   . Days of Exercise per Week:   . Minutes of Exercise per Session:   Stress:   . Feeling of Stress :   Social Connections:   . Frequency of Communication with Friends and Family:   . Frequency  of Social Gatherings with Friends and Family:   . Attends Religious Services:   . Active Member of Clubs or Organizations:   . Attends Archivist Meetings:   Marland Kitchen Marital Status:   Intimate Partner Violence:   . Fear of Current or Ex-Partner:   . Emotionally Abused:   Marland Kitchen Physically Abused:   . Sexually Abused:     FAMILY HISTORY:  Family History  Problem Relation Age of Onset  . Deep vein thrombosis Mother   . Heart failure Father   . Ovarian cancer Sister 88  . Lung cancer Brother 9  . Cancer Maternal Aunt        NOS    CURRENT MEDICATIONS:  Outpatient Encounter Medications as of 06/20/2019  Medication Sig  . apixaban (ELIQUIS) 5 MG TABS tablet Take 5 mg by mouth 2 (two) times daily.  . furosemide (LASIX) 20 MG tablet   . hydroxypropyl methylcellulose (ISOPTO TEARS) 2.5 % ophthalmic solution Place 1 drop into both eyes 3 (three) times  daily as needed for dry eyes.  . metoprolol tartrate (LOPRESSOR) 25 MG tablet Take 25 mg by mouth 2 (two) times daily with a meal.  . zolpidem (AMBIEN) 10 MG tablet TAKE 1 TABLET BY MOUTH EVERY DAY AT BEDTIME AS NEEDED FOR SLEEP  . HYDROcodone-acetaminophen (NORCO/VICODIN) 5-325 MG tablet   . magic mouthwash SOLN Take 5 mLs by mouth 4 (four) times daily. (Patient not taking: Reported on 09/28/2018)  . [DISCONTINUED] CARBOPLATIN IV Inject into the vein every 21 ( twenty-one) days.  . [DISCONTINUED] chlorproMAZINE (THORAZINE) 25 MG tablet Take 1 tablet (25 mg total) by mouth every 8 (eight) hours as needed.  . [DISCONTINUED] ETOPOSIDE IV Inject into the vein every 21 ( twenty-one) days.   No facility-administered encounter medications on file as of 06/20/2019.    ALLERGIES:  Allergies  Allergen Reactions  . Diltiazem   . Sulfa Antibiotics Nausea And Vomiting     PHYSICAL EXAM:  ECOG Performance status: 1  Vitals:   06/20/19 1455  BP: (!) 154/65  Pulse: 68  Resp: 18  Temp: (!) 96.9 F (36.1 C)  SpO2: 95%   Filed Weights   06/20/19 1455  Weight: 191 lb 6.4 oz (86.8 kg)    Physical Exam Constitutional:      Appearance: Normal appearance.  HENT:     Head: Normocephalic.  Cardiovascular:     Rate and Rhythm: Normal rate and regular rhythm.     Pulses: Normal pulses.     Heart sounds: Normal heart sounds.  Pulmonary:     Effort: Pulmonary effort is normal.     Breath sounds: Normal breath sounds.  Abdominal:     General: Bowel sounds are normal.     Palpations: Abdomen is soft.  Musculoskeletal:        General: Normal range of motion.     Cervical back: Normal range of motion.  Skin:    General: Skin is warm and dry.  Neurological:     General: No focal deficit present.     Mental Status: He is alert and oriented to person, place, and time.  Psychiatric:        Mood and Affect: Mood normal.        Behavior: Behavior normal.        Thought Content: Thought content  normal.        Judgment: Judgment normal.      LABORATORY DATA:  I have reviewed the labs as listed.  CBC    Component Value Date/Time   WBC 8.9 04/01/2019 1002   RBC 5.38 04/01/2019 1002   HGB 15.9 04/01/2019 1002   HCT 48.6 04/01/2019 1002   PLT 232 04/01/2019 1002   MCV 90.3 04/01/2019 1002   MCH 29.6 04/01/2019 1002   MCHC 32.7 04/01/2019 1002   RDW 14.1 04/01/2019 1002   LYMPHSABS 2.0 04/01/2019 1002   MONOABS 0.7 04/01/2019 1002   EOSABS 0.1 04/01/2019 1002   BASOSABS 0.0 04/01/2019 1002   CMP Latest Ref Rng & Units 04/01/2019 09/21/2018 05/18/2018  Glucose 70 - 99 mg/dL 100(H) 103(H) 123(H)  BUN 8 - 23 mg/dL _0 Creatinine 0.61 - 1.24 mg/dL 1.21 1.16 1.45(H)  Sodium 135 - 145 mmol/L 135 137 136  Potassium 3.5 - 5.1 mmol/L 4.7 4.7 4.1  Chloride 98 - 111 mmol/L 99 102 104  CO2 22 - 32 mmol/L _1 Calcium 8.9 - 10.3 mg/dL 9.3 9.4 9.3  Total Protein 6.5 - 8.1 g/dL 7.2 7.4 7.5  Total Bilirubin 0.3 - 1.2 mg/dL 1.3(H) 0.8 1.5(H)  Alkaline Phos 38 - 126 U/L 78 82 79  AST 15 - 41 U/L _2 ALT 0 - 44 U/L _3 I have independently reviewed images from [place]:   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HCC) 1.  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 -4 cycles of adjuvant chemotherapy with carboplatin and VP-16 from 10/16/2017 through 12/19/2017. -Last CT of the chest on 04/01/2019 at our center did not show any evidence of recurrence or metastatic disease.  Chronic left pleural effusion was stable. -He had reportedly developed right-sided rib pain, right low back pain and right shoulder pain for the last couple of months. -He was seeing a chiropractor in  [place] for the past few weeks.  Pain did not improve. -He went to the ER in [place] on 06/19/2019.  CT of the chest with contrast showed small to moderate left pleural effusion.  Scattered subcentimeter lung nodules in both lungs.  There was scattered radiolucencies within the skeleton indicating bone mets. -I have reviewed the discs of the CT scan.  I could not see the bone windows on the CT scan. -Patient denies any recent respiratory infections. -I have recommended a PET CT scan for further work-up.  2.  Weight loss: -He reported 7 pound weight loss in the last 3 months.  He reports decreased appetite secondary to pain.  3.  Back pain/left shoulder pain/right-sided rib pain: -He has been taking Tylenol 650 mg 2-3 times a day. -He received a prescription for hydrocodone 10 mg in the ER yesterday.  I have suggested him to take half a tablet if the pain is severe.  3.  Atrial fibrillation: -He has a pacemaker.  He is continuing Eliquis twice daily without any bleeding complications.  4.  Insomnia: -He will continue Ambien 10 mg half tablet 2 times a night as needed.  5.  Family history: -Personal history of prostate cancer which was resected in 05/04/2013.  Brother died of lung cancer and was a smoker.  Sister died of ovarian cancer.  Genetic testing was negative.      Orders placed this encounter:  Orders Placed This Encounter  Procedures  . NM PET Image Initial (PI) Skull Base To Thigh      Derek Jack, MD  [place] [phone]

## 2019-06-20 NOTE — Assessment & Plan Note (Signed)
1.  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 -4 cycles of adjuvant chemotherapy with carboplatin and VP-16 from 10/16/2017 through 12/19/2017. -Last CT of the chest on 04/01/2019 at our center did not show any evidence of recurrence or metastatic disease.  Chronic left pleural effusion was stable. -He had reportedly developed right-sided rib pain, right low back pain and right shoulder pain for the last couple of months. -He was seeing a chiropractor in [place] for the past few weeks.  Pain did not improve. -He went to the ER in [place] on 06/19/2019.  CT of the chest with contrast showed small to moderate left pleural effusion.  Scattered subcentimeter lung nodules in both lungs.  There was scattered radiolucencies within the skeleton indicating bone mets. -I have reviewed the discs of the CT scan.  I could not see the bone windows on the CT scan. -Patient denies any recent respiratory infections. -I have recommended a PET CT scan for further work-up.  2.  Weight loss: -He reported 7 pound weight loss in the last 3 months.  He reports decreased appetite secondary to pain.  3.  Back pain/left shoulder pain/right-sided rib pain: -He has been taking Tylenol 650 mg 2-3 times a day. -He received a prescription for hydrocodone 10 mg in the ER yesterday.  I have suggested him to take half a tablet if the pain is severe.  3.  Atrial fibrillation: -He has a pacemaker.  He is continuing Eliquis twice daily without any bleeding complications.  4.  Insomnia: -He will continue Ambien 10 mg half tablet 2 times a night as needed.  5.  Family history: -Personal history of prostate cancer which was resected in 2013-06-05.  Brother died of lung  cancer and was a smoker.  Sister died of ovarian cancer.  Genetic testing was negative.

## 2019-06-24 ENCOUNTER — Encounter (HOSPITAL_COMMUNITY): Payer: Self-pay | Admitting: *Deleted

## 2019-06-24 ENCOUNTER — Ambulatory Visit
Admission: RE | Admit: 2019-06-24 | Discharge: 2019-06-24 | Disposition: A | Discharge status: Home / Self Care | Payer: Self-pay | Source: Ambulatory Visit | Attending: Hematology | Admitting: Hematology

## 2019-06-24 ENCOUNTER — Other Ambulatory Visit (HOSPITAL_COMMUNITY): Payer: Self-pay | Admitting: Hematology

## 2019-06-24 DIAGNOSIS — C7A8 Other malignant neuroendocrine tumors: Secondary | ICD-10-CM

## 2019-06-24 NOTE — Progress Notes (Signed)
Patient called clinic asking if the PET scan could be scheduled in [facility] faster than here.  He is currently scheduled for PET on 4/27 at [facility].  I explained to them that the process would take longer to get the authorization approved for different location and then to get scheduled.  They are okay with leaving the appt here at [facility].    Per Dr. Delton Coombes, he wanted me to check with patient about his pain control.  His wife states that he is taking hydrocodone 1/2 pill every 4 hours and the pain comes back around 2 - 2.5 hours.  I advised her to have him take it as prescribed which is 1 pill every 4 hours.  I advised for her to take it around the clock and see if that gives him better pain control.  I explained the efficacy of pain medication and how it needs to be continuously controlled rather than bouncing around here and there.  She verbalizes understanding.  I explained for her also to make sure he has a bowel movement every 2 days at the most.  She said he goes pretty regularly now, but will keep a check on it.    She is to call back to the clinic by Wednesday if taking 1 whole pill ever 4 hours doesn't help his pain.

## 2019-06-25 ENCOUNTER — Other Ambulatory Visit (HOSPITAL_COMMUNITY): Payer: Self-pay | Admitting: *Deleted

## 2019-06-26 ENCOUNTER — Ambulatory Visit (HOSPITAL_COMMUNITY): Payer: Medicare Other | Admitting: Hematology

## 2019-06-26 MED ORDER — HYDROCODONE-ACETAMINOPHEN 5-325 MG PO TABS: 1.0000 | ORAL_TABLET | ORAL | 0 refills | Status: DC | PRN

## 2019-07-02 ENCOUNTER — Other Ambulatory Visit: Payer: Self-pay

## 2019-07-02 ENCOUNTER — Ambulatory Visit (HOSPITAL_COMMUNITY)
Admission: RE | Admit: 2019-07-02 | Discharge: 2019-07-02 | Disposition: A | Discharge status: Home / Self Care | Payer: Medicare HMO | Source: Ambulatory Visit | Attending: Hematology | Admitting: Hematology

## 2019-07-02 DIAGNOSIS — I2510 Atherosclerotic heart disease of native coronary artery without angina pectoris: Secondary | ICD-10-CM | POA: Diagnosis not present

## 2019-07-02 DIAGNOSIS — M25511 Pain in right shoulder: Secondary | ICD-10-CM | POA: Insufficient documentation

## 2019-07-02 DIAGNOSIS — J90 Pleural effusion, not elsewhere classified: Secondary | ICD-10-CM | POA: Insufficient documentation

## 2019-07-02 DIAGNOSIS — R918 Other nonspecific abnormal finding of lung field: Secondary | ICD-10-CM | POA: Diagnosis not present

## 2019-07-02 DIAGNOSIS — M545 Low back pain: Secondary | ICD-10-CM | POA: Diagnosis not present

## 2019-07-02 DIAGNOSIS — I517 Cardiomegaly: Secondary | ICD-10-CM | POA: Diagnosis not present

## 2019-07-02 DIAGNOSIS — I700 Atherosclerosis of aorta: Secondary | ICD-10-CM | POA: Insufficient documentation

## 2019-07-02 DIAGNOSIS — C7A8 Other malignant neuroendocrine tumors: Secondary | ICD-10-CM | POA: Insufficient documentation

## 2019-07-02 LAB — GLUCOSE, CAPILLARY: Glucose-Capillary: 104 mg/dL — ABNORMAL HIGH (ref 70–99)

## 2019-07-02 MED ORDER — FLUDEOXYGLUCOSE F - 18 (FDG) INJECTION
9.3000 | Freq: Once | INTRAVENOUS | Status: AC | PRN
  Administered 2019-07-02: 12:00:00 9.3 via INTRAVENOUS

## 2019-07-04 ENCOUNTER — Other Ambulatory Visit: Payer: Self-pay

## 2019-07-04 ENCOUNTER — Encounter (HOSPITAL_COMMUNITY): Payer: Self-pay | Admitting: Radiology

## 2019-07-04 ENCOUNTER — Other Ambulatory Visit: Payer: Self-pay | Admitting: Radiology

## 2019-07-04 ENCOUNTER — Encounter (HOSPITAL_COMMUNITY): Payer: Self-pay | Admitting: Hematology

## 2019-07-04 ENCOUNTER — Inpatient Hospital Stay (HOSPITAL_COMMUNITY): Payer: Medicare HMO | Admitting: Hematology

## 2019-07-04 VITALS — BP 110/71 | HR 101 | Temp 96.8°F | Resp 18 | Wt 180.8 lb

## 2019-07-04 DIAGNOSIS — K769 Liver disease, unspecified: Secondary | ICD-10-CM

## 2019-07-04 DIAGNOSIS — C7A8 Other malignant neuroendocrine tumors: Secondary | ICD-10-CM | POA: Diagnosis not present

## 2019-07-04 MED ORDER — OXYCODONE-ACETAMINOPHEN 5-325 MG PO TABS
ORAL_TABLET | ORAL | Status: AC
  Filled 2019-07-04: qty 2

## 2019-07-04 MED ORDER — GABAPENTIN 300 MG PO CAPS: 300.0000 mg | ORAL_CAPSULE | Freq: Two times a day (BID) | ORAL | 1 refills | Status: AC

## 2019-07-04 MED ORDER — OXYCODONE HCL 10 MG PO TABS: 10.0000 mg | ORAL_TABLET | ORAL | 0 refills | Status: DC | PRN

## 2019-07-04 MED ORDER — OXYCODONE-ACETAMINOPHEN 5-325 MG PO TABS
2.0000 | ORAL_TABLET | Freq: Once | ORAL | Status: AC
  Administered 2019-07-04: 2 via ORAL

## 2019-07-04 MED ORDER — LACTULOSE 20 GM/30ML PO SOLN: 30.0000 mL | Freq: Every day | ORAL | 2 refills | Status: AC

## 2019-07-04 NOTE — Patient Instructions (Addendum)
[place] at [facility] Discharge Instructions  You were seen today by Dr. Delton Coombes. He went over your recent scan results. He recommends that you have a liver biopsy to further evaluate the findings of the scan. He will send in a new prescription for pain medication. He will see you back after the biopsy for follow up and to discuss treatment options.   Thank you for choosing [place] at [facility] to provide your oncology and hematology care.  To afford each patient quality time with our provider, please arrive at least 15 minutes before your scheduled appointment time.   If you have a lab appointment with the [facility] please come in thru the  Main Entrance and check in at the main information desk  You need to re-schedule your appointment should you arrive 10 or more minutes late.  We strive to give you quality time with our providers, and arriving late affects you and other patients whose appointments are after yours.  Also, if you no show three or more times for appointments you may be dismissed from the clinic at the providers discretion.     Again, thank you for choosing [facility].  Our hope is that these requests will decrease the amount of time that you wait before being seen by our physicians.       _____________________________________________________________  Should you have questions after your visit to [facility], please contact our office at [phone] between the hours of 8:00 a.m. and 4:30 p.m.  Voicemails left after 4:00 p.m. will not be returned until the following business day.  For prescription refill requests, have your pharmacy contact our office and allow 72 hours.    Cancer Center Support Programs:   > Cancer Support Group  2nd Tuesday of the month 1pm-2pm, Journey Room

## 2019-07-04 NOTE — Progress Notes (Signed)
07/04/19  Order received to give patient Percocet 5-325 mg x 2 tablets in clinic.  Order entered as above.  T.O. Dr Rhys Martini, PharmD

## 2019-07-04 NOTE — Progress Notes (Signed)
[place], [place] [address]   CLINIC:  Medical Oncology/Hematology  PCP:  Frederic Jericho, NP [address] [place] [address] [phone]   REASON FOR VISIT:  Metastatic carcinoma.  CURRENT THERAPY: Under work-up.  BRIEF ONCOLOGIC HISTORY:  Oncology History  Prostate cancer ([place])  06/24/2013 Initial Diagnosis   Prostate cancer ([place])   02/22/2018 Genetic Testing   Negative genetic testing on the common hereditary cancer panel.  The Hereditary Gene Panel offered by [facility] includes sequencing and/or deletion duplication testing of the following 47 genes: APC, ATM, AXIN2, BARD1, BMPR1A, BRCA1, BRCA2, BRIP1, CDH1, CDK4, CDKN2A (p14ARF), CDKN2A (p16INK4a), CHEK2, CTNNA1, DICER1, EPCAM (Deletion/duplication testing only), GREM1 (promoter region deletion/duplication testing only), KIT, MEN1, MLH1, MSH2, MSH3, MSH6, MUTYH, NBN, NF1, NHTL1, PALB2, PDGFRA, PMS2, POLD1, POLE, PTEN, RAD50, RAD51C, RAD51D, RET, SDHB, SDHC, SDHD, SMAD4, SMARCA4. STK11, TP53, TSC1, TSC2, and VHL.  The following genes were evaluated for sequence changes only: SDHA and HOXB13 c.251G>A variant only. The report date is February 22, 2018.   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[place])  07/27/2017 PET scan   Left lower lobe lung mass with SUV of 14.6, probable metastatic left hilar lymph node, no evidence of distant metastasis   08/17/2017 Surgery   VATS left upper lobe lobectomy, mediastinal lymphadenectomy, pathology consistent with 2.6 cm x 2.1 x 2 cm large cell neuroendocrine carcinoma, station 5, station 6, station 11 lymph nodes not involved, 2/9 peribronchial lymph nodes positive for large cell neuroendocrine carcinoma, negative visceral pleural involvement   10/03/2017 -  Chemotherapy   The patient had palonosetron (ALOXI) injection 0.25 mg, 0.25 mg, Intravenous,  Once, 4 of 4 cycles Administration: 0.25 mg (10/16/2017), 0.25 mg (11/07/2017), 0.25 mg (11/28/2017), 0.25 mg  (12/19/2017) pegfilgrastim (NEULASTA ONPRO KIT) injection 6 mg, 6 mg, Subcutaneous, Once, 3 of 3 cycles Administration: 6 mg (10/18/2017), 6 mg (11/09/2017), 6 mg (11/30/2017) pegfilgrastim-cbqv (UDENYCA) injection 6 mg, 6 mg, Subcutaneous, Once, 1 of 1 cycle CARBOplatin (PARAPLATIN) 500 mg in sodium chloride 0.9 % 250 mL chemo infusion, 500 mg (100 % of original dose 497 mg), Intravenous,  Once, 4 of 4 cycles Dose modification:   (original dose 497 mg, Cycle 1),   (original dose 467.5 mg, Cycle 2),   (original dose 470 mg, Cycle 3),   (original dose 437.5 mg, Cycle 4) Administration: 500 mg (10/16/2017), 470 mg (11/07/2017), 470 mg (11/28/2017), 440 mg (12/19/2017) etoposide (VEPESID) 200 mg in sodium chloride 0.9 % 500 mL chemo infusion, 90 mg/m2 = 220 mg, Intravenous,  Once, 4 of 4 cycles Administration: 200 mg (10/16/2017), 200 mg (10/17/2017), 200 mg (10/18/2017), 200 mg (11/07/2017), 200 mg (11/08/2017), 200 mg (11/09/2017), 200 mg (11/28/2017), 200 mg (11/29/2017), 200 mg (11/30/2017), 200 mg (12/19/2017), 200 mg (12/20/2017), 200 mg (12/21/2017) fosaprepitant (EMEND) 150 mg, dexamethasone (DECADRON) 12 mg in sodium chloride 0.9 % 145 mL IVPB, , Intravenous,  Once, 4 of 4 cycles Administration:  (10/16/2017),  (11/07/2017),  (11/28/2017),  (12/19/2017)  for chemotherapy treatment.    02/22/2018 Genetic Testing   Negative genetic testing on the common hereditary cancer panel.  The Hereditary Gene Panel offered by [facility] includes sequencing and/or deletion duplication testing of the following 47 genes: APC, ATM, AXIN2, BARD1, BMPR1A, BRCA1, BRCA2, BRIP1, CDH1, CDK4, CDKN2A (p14ARF), CDKN2A (p16INK4a), CHEK2, CTNNA1, DICER1, EPCAM (Deletion/duplication testing only), GREM1 (promoter region deletion/duplication testing only), KIT, MEN1, MLH1, MSH2, MSH3, MSH6, MUTYH, NBN, NF1, NHTL1, PALB2, PDGFRA, PMS2, POLD1, POLE, PTEN, RAD50, RAD51C,  RAD51D, RET, SDHB, SDHC, SDHD, SMAD4, SMARCA4. STK11, TP53, TSC1, TSC2, and VHL.  The  following genes were evaluated for sequence changes only: SDHA and HOXB13 c.251G>A variant only. The report date is February 22, 2018.       INTERVAL HISTORY:  Douglas Baxter 79 y.o. male seen for follow-up of metastatic carcinoma.  He recently had PET scan done.  He had severe pain while getting on the PET scan table.  Appetite and energy levels are 25%.  Reports mid and lower back pain which is severe and poorly controlled with the current pain drug of hydrocodone 10/325, he is taking every 4 hours sometimes 2 tablets every 4 hours.  Also reports some constipation which is not well controlled.  He has lost some weight because of the pain.    REVIEW OF SYSTEMS:  Review of Systems  Respiratory: Positive for shortness of breath.   Gastrointestinal: Positive for constipation.  Musculoskeletal: Positive for back pain.  Psychiatric/Behavioral: Positive for sleep disturbance.  All other systems reviewed and are negative.    PAST MEDICAL/SURGICAL HISTORY:  Past Medical History:  Diagnosis Date  . Arthritis   . Cancer [facility])    prostate  . Family history of lung cancer   . Family history of ovarian cancer   . Hematuria   . Nocturia    Past Surgical History:  Procedure Laterality Date  . [place]   lower back  . CHOLECYSTECTOMY  2000  . LYMPHADENECTOMY Bilateral 06/24/2013   Procedure: LYMPHADENECTOMY;  Surgeon: [name], MD;  Location: [facility]S;  Service: Urology;  Laterality: Bilateral;  . MENISCUS REPAIR Right jan 2014  . PROSTATE BIOPSY  Apr 26, 2013  . ROBOT ASSISTED LAPAROSCOPIC RADICAL PROSTATECTOMY N/A 06/24/2013   Procedure: ROBOTIC ASSISTED LAPAROSCOPIC RADICAL PROSTATECTOMY LEVEL 2;  Surgeon: [name], MD;  Location: [facility];  Service: Urology;  Laterality: N/A;     SOCIAL HISTORY:  Social History   Socioeconomic History  . Marital status: Married    Spouse name: Not on file  . Number of children: Not on file  . Years of education: Not on file  . Highest  education level: Not on file  Occupational History  . Not on file  Tobacco Use  . Smoking status: Former Smoker    Packs/day: 1.00    Types: Cigarettes    Quit date: 03/07/1978    Years since quitting: 41.3  . Smokeless tobacco: Never Used  Substance and Sexual Activity  . Alcohol use: Yes    Comment:  occasional  . Drug use: No  . Sexual activity: Not on file  Other Topics Concern  . Not on file  Social History Narrative  . Not on file   Social Determinants of Health   Financial Resource Strain:   . Difficulty of Paying Living Expenses:   Food Insecurity:   . Worried About Charity fundraiser in the Last Year:   . Arboriculturist in the Last Year:   Transportation Needs:   . Film/video editor (Medical):   Marland Kitchen Lack of Transportation (Non-Medical):   Physical Activity:   . Days of Exercise per Week:   . Minutes of Exercise per Session:   Stress:   . Feeling of Stress :   Social Connections:   . Frequency of Communication with Friends and Family:   . Frequency of Social Gatherings with Friends and Family:   . Attends Religious Services:   . Active Member  of Clubs or Organizations:   . Attends Archivist Meetings:   Marland Kitchen Marital Status:   Intimate Partner Violence:   . Fear of Current or Ex-Partner:   . Emotionally Abused:   Marland Kitchen Physically Abused:   . Sexually Abused:     FAMILY HISTORY:  Family History  Problem Relation Age of Onset  . Deep vein thrombosis Mother   . Heart failure Father   . Ovarian cancer Sister 26  . Lung cancer Brother 26  . Cancer Maternal Aunt        NOS    CURRENT MEDICATIONS:  Outpatient Encounter Medications as of 07/04/2019  Medication Sig  . apixaban (ELIQUIS) 5 MG TABS tablet Take 5 mg by mouth 2 (two) times daily.  . furosemide (LASIX) 20 MG tablet   . HYDROcodone-acetaminophen (NORCO/VICODIN) 5-325 MG tablet Take 1 tablet by mouth every 4 (four) hours as needed for moderate pain.  . hydroxypropyl methylcellulose  (ISOPTO TEARS) 2.5 % ophthalmic solution Place 1 drop into both eyes 3 (three) times daily as needed for dry eyes.  . metoprolol tartrate (LOPRESSOR) 25 MG tablet Take 25 mg by mouth daily.   Marland Kitchen gabapentin (NEURONTIN) 300 MG capsule Take 1 capsule (300 mg total) by mouth 2 (two) times daily.  . Lactulose 20 GM/30ML SOLN Take 30 mLs (20 g total) by mouth at bedtime.  . magic mouthwash SOLN Take 5 mLs by mouth 4 (four) times daily. (Patient not taking: Reported on 09/28/2018)  . Oxycodone HCl 10 MG TABS Take 1 tablet (10 mg total) by mouth every 4 (four) hours as needed.  . zolpidem (AMBIEN) 10 MG tablet TAKE 1 TABLET BY MOUTH EVERY DAY AT BEDTIME AS NEEDED FOR SLEEP (Patient not taking: Reported on 07/04/2019)  . [EXPIRED] oxyCODONE-acetaminophen (PERCOCET/ROXICET) 5-325 MG per tablet 2 tablet    No facility-administered encounter medications on file as of 07/04/2019.    ALLERGIES:  Allergies  Allergen Reactions  . Diltiazem   . Sulfa Antibiotics Nausea And Vomiting     PHYSICAL EXAM:  ECOG Performance status: 1  Vitals:   07/04/19 0943  BP: 110/71  Pulse: (!) 101  Resp: 18  Temp: (!) 96.8 F (36 C)  SpO2: 94%   Filed Weights   07/04/19 0943  Weight: 180 lb 12.8 oz (82 kg)    Physical Exam Constitutional:      Appearance: Normal appearance.  HENT:     Head: Normocephalic.  Cardiovascular:     Rate and Rhythm: Normal rate and regular rhythm.     Pulses: Normal pulses.     Heart sounds: Normal heart sounds.  Pulmonary:     Effort: Pulmonary effort is normal.     Breath sounds: Normal breath sounds.  Abdominal:     General: Bowel sounds are normal.     Palpations: Abdomen is soft.  Musculoskeletal:        General: Normal range of motion.     Cervical back: Normal range of motion.  Skin:    General: Skin is warm and dry.  Neurological:     General: No focal deficit present.     Mental Status: He is alert and oriented to person, place, and time.  Psychiatric:         Mood and Affect: Mood normal.        Behavior: Behavior normal.        Thought Content: Thought content normal.        Judgment: Judgment normal.  LABORATORY DATA:  I have reviewed the labs as listed.  CBC    Component Value Date/Time   WBC 8.9 04/01/2019 1002   RBC 5.38 04/01/2019 1002   HGB 15.9 04/01/2019 1002   HCT 48.6 04/01/2019 1002   PLT 232 04/01/2019 1002   MCV 90.3 04/01/2019 1002   MCH 29.6 04/01/2019 1002   MCHC 32.7 04/01/2019 1002   RDW 14.1 04/01/2019 1002   LYMPHSABS 2.0 04/01/2019 1002   MONOABS 0.7 04/01/2019 1002   EOSABS 0.1 04/01/2019 1002   BASOSABS 0.0 04/01/2019 1002   CMP Latest Ref Rng & Units 04/01/2019 09/21/2018 05/18/2018  Glucose 70 - 99 mg/dL 100(H) 103(H) 123(H)  BUN 8 - 23 mg/dL _0 Creatinine 0.61 - 1.24 mg/dL 1.21 1.16 1.45(H)  Sodium 135 - 145 mmol/L 135 137 136  Potassium 3.5 - 5.1 mmol/L 4.7 4.7 4.1  Chloride 98 - 111 mmol/L 99 102 104  CO2 22 - 32 mmol/L _1 Calcium 8.9 - 10.3 mg/dL 9.3 9.4 9.3  Total Protein 6.5 - 8.1 g/dL 7.2 7.4 7.5  Total Bilirubin 0.3 - 1.2 mg/dL 1.3(H) 0.8 1.5(H)  Alkaline Phos 38 - 126 U/L 78 82 79  AST 15 - 41 U/L _2 ALT 0 - 44 U/L _3 I have independently reviewed the PET scan and discussed with the patient.  ASSESSMENT & PLAN:   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HCC) 1.  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 -4 cycles of adjuvant chemotherapy with carboplatin and VP-16 from 10/16/2017 through 12/19/2017. -We reviewed results of the PET scan from 07/02/2019 which showed widespread hypermetabolic malignancy with extensive involvement of the liver and extensive lytic involvement of the skeleton.  Small but hypermetabolic lymph nodes in the left neck and chest compatible with malignancy.  Small but new/enlarging  pulmonary nodules present. -I have recommended ultrasound-guided biopsy of the liver lesion.  We will arrange it ASAP. -He can come off of Eliquis for 2 days prior to the procedure.  I will see him back after the biopsy.  2.  Back pain/left shoulder pain/right-sided rib pain: -He is taking hydrocodone 10 mg every 6 hours.  Sometimes he takes 2 tablets at a time if the pain is too much. -I will start him on oxycodone 10 mg every 4 hours as needed.  He has used lidocaine patch in the past without any help. -I will also add gabapentin 300 mg twice daily.  3.  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  4.  Atrial fibrillation: -He has a pacemaker.  He is continuing Eliquis twice daily without any bleeding complications.  5.  Insomnia: -He will use Ambien 10 mg half tablet 2 times a night as needed.  6.  Family history: -Brother died of lung cancer in a smoker.  Sister died of ovarian cancer.  Genetic testing was negative.  7.  Constipation: -he is taking stool softeners and milk of magnesia as needed.  Still he is constipated. -We will start him on lactulose twice daily.      Orders placed this encounter:  Orders Placed This Encounter  Procedures  . US BIOPSY (LIVER)   Total time spent is 40 minutes with more than 50% of the time spent face-to-face discussing and reviewing scans, further work-up, pain management, counseling  and coordination of care.   Derek Jack, MD  [place] [phone]

## 2019-07-04 NOTE — Progress Notes (Signed)
Douglas Baxter Male, 79 y.o., 29-Nov-1940 MRN:  [id number] Phone:  [phone] (H) PCP:  Frederic Jericho, NP Coverage:  [place] [employer] Medicare Hmo/Ppo Next Appt With Radiology (MC-US 2) 07/08/2019 at 1:00 PM  RE: US Liver Biopsy Received: Today Message Contents  Derek Jack, MD  Garth Bigness D  Yes. Please schedule ASAP. Thanks.       Previous Messages   ----- Message -----  From: Garth Bigness D  Sent: 07/04/2019 10:48 AM EDT  To: Derek Jack, MD  Subject: FW: US Liver Biopsy                Patient is on Eliquis and will need to hold for 2 days prior to Biopsy. Is it okay to hold prior to Biopsy? Thanks Aniceto Boss  ----- Message -----  From: Garth Bigness D  Sent: 07/04/2019 10:38 AM EDT  To: Ir Procedure Requests  Subject: US Liver Biopsy                  Procedure:  US Liver Biopsy   Reason:  liver lesions   History: NM PET, CT in computer...outside CT uploaded   Provider: Derek Jack   Provider Contact: [phone]

## 2019-07-04 NOTE — Progress Notes (Signed)
Douglas Baxter Male, 79 y.o., 12-06-1940 MRN:  [id number] Phone:  [phone] (H) PCP:  Frederic Jericho, NP Coverage:  [place] [employer] Medicare Hmo/Ppo Next Appt With Radiology (MC-US 2) 07/08/2019 at 1:00 PM  RE: US Liver Biopsy Received: Today Message Contents  Jacqulynn Cadet, MD  [name]; P Ir Procedure Requests  US guided biopsy of LEFT SUBMANDIBULAR LN, PET +, hx of neuroendocrine tumor with what appears to be widespread metastatic disease (liver, lymph nodes, bone)   HKM       Previous Messages   ----- Message -----  From: Garth Bigness D  Sent: 07/04/2019 10:38 AM EDT  To: Ir Procedure Requests  Subject: US Liver Biopsy                  Procedure:  US Liver Biopsy   Reason:  liver lesions   History: NM PET, CT in computer...outside CT uploaded   Provider: Derek Jack   Provider Contact: [phone]

## 2019-07-04 NOTE — Assessment & Plan Note (Addendum)
1.  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 -4 cycles of adjuvant chemotherapy with carboplatin and VP-16 from 10/16/2017 through 12/19/2017. -We reviewed results of the PET scan from 07/02/2019 which showed widespread hypermetabolic malignancy with extensive involvement of the liver and extensive lytic involvement of the skeleton.  Small but hypermetabolic lymph nodes in the left neck and chest compatible with malignancy.  Small but new/enlarging pulmonary nodules present. -I have recommended ultrasound-guided biopsy of the liver lesion.  We will arrange it ASAP. -He can come off of Eliquis for 2 days prior to the procedure.  I will see him back after the biopsy.  2.  Back pain/left shoulder pain/right-sided rib pain: -He is taking hydrocodone 10 mg every 6 hours.  Sometimes he takes 2 tablets at a time if the pain is too much. -I will start him on oxycodone 10 mg every 4 hours as needed.  He has used lidocaine patch in the past without any help. -I will also add gabapentin 300 mg twice daily.  3.  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  4.  Atrial fibrillation: -He has a pacemaker.  He is continuing Eliquis twice daily without any bleeding complications.  5.  Insomnia: -He will use Ambien 10 mg half tablet 2 times a night as needed.  6.  Family history: -Brother died of lung cancer in a smoker.  Sister died of ovarian cancer.  Genetic testing was negative.  7.  Constipation: -he is taking stool softeners and milk of magnesia as needed.  Still he is constipated. -We will start him on lactulose twice daily.

## 2019-07-05 ENCOUNTER — Other Ambulatory Visit: Payer: Self-pay | Admitting: Student

## 2019-07-08 ENCOUNTER — Other Ambulatory Visit: Payer: Self-pay

## 2019-07-08 ENCOUNTER — Encounter (HOSPITAL_COMMUNITY): Payer: Self-pay

## 2019-07-08 ENCOUNTER — Other Ambulatory Visit (HOSPITAL_COMMUNITY): Payer: Self-pay | Admitting: Hematology

## 2019-07-08 ENCOUNTER — Ambulatory Visit (HOSPITAL_COMMUNITY)
Admission: RE | Admit: 2019-07-08 | Discharge: 2019-07-08 | Disposition: A | Discharge status: Home / Self Care | Payer: Medicare HMO | Source: Ambulatory Visit | Attending: Hematology | Admitting: Hematology

## 2019-07-08 DIAGNOSIS — C7B8 Other secondary neuroendocrine tumors: Secondary | ICD-10-CM | POA: Diagnosis not present

## 2019-07-08 DIAGNOSIS — Z7901 Long term (current) use of anticoagulants: Secondary | ICD-10-CM | POA: Insufficient documentation

## 2019-07-08 DIAGNOSIS — Z87891 Personal history of nicotine dependence: Secondary | ICD-10-CM | POA: Insufficient documentation

## 2019-07-08 DIAGNOSIS — Z85118 Personal history of other malignant neoplasm of bronchus and lung: Secondary | ICD-10-CM | POA: Insufficient documentation

## 2019-07-08 DIAGNOSIS — K769 Liver disease, unspecified: Secondary | ICD-10-CM

## 2019-07-08 DIAGNOSIS — Z8546 Personal history of malignant neoplasm of prostate: Secondary | ICD-10-CM | POA: Insufficient documentation

## 2019-07-08 DIAGNOSIS — Z9221 Personal history of antineoplastic chemotherapy: Secondary | ICD-10-CM | POA: Insufficient documentation

## 2019-07-08 DIAGNOSIS — Z79899 Other long term (current) drug therapy: Secondary | ICD-10-CM | POA: Diagnosis not present

## 2019-07-08 DIAGNOSIS — C7A8 Other malignant neuroendocrine tumors: Secondary | ICD-10-CM | POA: Diagnosis present

## 2019-07-08 LAB — CBC
HCT: 43.3 % (ref 39.0–52.0)
Hemoglobin: 14.3 g/dL (ref 13.0–17.0)
MCH: 29.9 pg (ref 26.0–34.0)
MCHC: 33 g/dL (ref 30.0–36.0)
MCV: 90.4 fL (ref 80.0–100.0)
Platelets: 256 10*3/uL (ref 150–400)
RBC: 4.79 MIL/uL (ref 4.22–5.81)
RDW: 14.3 % (ref 11.5–15.5)
WBC: 7.4 10*3/uL (ref 4.0–10.5)
nRBC: 0 % (ref 0.0–0.2)

## 2019-07-08 LAB — PROTIME-INR
INR: 1.1 (ref 0.8–1.2)
Prothrombin Time: 13.8 seconds (ref 11.4–15.2)

## 2019-07-08 MED ORDER — MIDAZOLAM HCL 2 MG/2ML IJ SOLN
INTRAMUSCULAR | Status: AC
  Filled 2019-07-08: qty 2

## 2019-07-08 MED ORDER — LIDOCAINE HCL (PF) 1 % IJ SOLN
INTRAMUSCULAR | Status: AC
  Filled 2019-07-08: qty 30

## 2019-07-08 MED ORDER — FENTANYL CITRATE (PF) 100 MCG/2ML IJ SOLN
INTRAMUSCULAR | Status: AC
  Filled 2019-07-08: qty 2

## 2019-07-08 MED ORDER — HYDROCODONE-ACETAMINOPHEN 5-325 MG PO TABS: 1.0000 | ORAL_TABLET | ORAL | Status: DC | PRN

## 2019-07-08 MED ORDER — MIDAZOLAM HCL 2 MG/2ML IJ SOLN
INTRAMUSCULAR | Status: AC | PRN
  Administered 2019-07-08: 1 mg via INTRAVENOUS

## 2019-07-08 MED ORDER — SODIUM CHLORIDE 0.9 % IV SOLN: INTRAVENOUS | Status: DC

## 2019-07-08 MED ORDER — FENTANYL CITRATE (PF) 100 MCG/2ML IJ SOLN
INTRAMUSCULAR | Status: AC | PRN
  Administered 2019-07-08: 50 ug via INTRAVENOUS

## 2019-07-08 NOTE — Procedures (Signed)
  Procedure: US guided core biopsy Left submandibular adenopathy   EBL:   minimal Complications:  none immediate  See full dictation in [facility].  Dillard Cannon MD Main # [phone] Pager  [phone]

## 2019-07-08 NOTE — Progress Notes (Signed)
D/C instructions reviewed with pt and pt's wife. Verbalized understanding

## 2019-07-08 NOTE — H&P (Signed)
Chief Complaint: Patient was seen in consultation today for left submandibular lymph node biopsy at the request of [place]  Referring Physician(s): Katragadda,Sreedhar  Supervising Physician: Arne Cleveland  Patient Status: [facility] - Out-pt  History of Present Illness: Douglas Baxter is a 79 y.o. 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 Widespread hypermetabolic malignancy with extensive involvement of the liver and extensive lytic involvement of the skeleton. Small but hypermetabolic lymph nodes in the neck and chest compatible with malignancy. Small but new/enlarging pulmonary nodules are generally below sensitive PET-CT size thresholds and could be malignant or from superimposed infection. 2. Other imaging findings of potential clinical significance: Aortic Atherosclerosis (ICD10-I70.0). Coronary atherosclerosis. Moderate cardiomegaly. Small left pleural effusion. Prostatectomy.  Scheduled now for left submandibular LN biopsy per IR Rad review.  LD Eliquis 3 days ago   Past Medical History:  Diagnosis Date  . Arthritis   . Cancer [facility])    prostate  . Family history of lung cancer   . Family history of ovarian cancer   . Hematuria   . Nocturia     Past Surgical History:  Procedure Laterality Date  . [place]   lower back  . CHOLECYSTECTOMY  2000  . LYMPHADENECTOMY Bilateral 06/24/2013   Procedure: LYMPHADENECTOMY;  Surgeon: [name], MD;  Location: [facility] ORS;  Service: Urology;  Laterality: Bilateral;  . MENISCUS REPAIR Right jan 2014  . PROSTATE BIOPSY  Apr 26, 2013  . ROBOT ASSISTED LAPAROSCOPIC RADICAL PROSTATECTOMY  N/A 06/24/2013   Procedure: ROBOTIC ASSISTED LAPAROSCOPIC RADICAL PROSTATECTOMY LEVEL 2;  Surgeon: [name], MD;  Location: [facility] ORS;  Service: Urology;  Laterality: N/A;    Allergies: Diltiazem and Sulfa antibiotics  Medications: Prior to Admission medications   Medication Sig Start Date End Date Taking? Authorizing Provider  apixaban (ELIQUIS) 5 MG TABS tablet Take 5 mg by mouth 2 (two) times daily.   Yes [provider]  furosemide (LASIX) 20 MG tablet Take 20 mg by mouth daily.  12/19/17  Yes [provider]  HYDROcodone-acetaminophen (NORCO/VICODIN) 5-325 MG tablet Take 1 tablet by mouth every 4 (four) hours as needed for moderate pain. 06/26/19  Yes Derek Jack, MD  metoprolol tartrate (LOPRESSOR) 25 MG tablet Take 25 mg by mouth daily.  11/10/17  Yes [provider]  Oxycodone HCl 10 MG TABS Take 1 tablet (10 mg total) by mouth every 4 (four) hours as needed. 07/04/19  Yes Derek Jack, MD  Polyethyl Glycol-Propyl Glycol (SYSTANE) 0.4-0.3 % GEL ophthalmic gel Place 1 application into both eyes in the morning and at bedtime.   Yes [provider]  zolpidem (AMBIEN) 10 MG tablet TAKE 1 TABLET BY MOUTH EVERY DAY AT BEDTIME AS NEEDED FOR SLEEP Patient taking differently: Take 10 mg by mouth at bedtime.  03/28/19  Yes Lockamy, Randi L, NP-C  gabapentin (NEURONTIN) 300 MG capsule Take 1 capsule (300 mg total) by mouth 2 (two) times daily. 07/04/19   Derek Jack, MD  Lactulose 20 GM/30ML SOLN Take 30 mLs (20 g total) by mouth at bedtime. 07/04/19   Derek Jack, MD  magic mouthwash SOLN Take 5 mLs by mouth 4 (four) times daily. Patient  not taking: Reported on 09/28/2018 11/28/17   Glennie Isle, NP-C     Family History  Problem Relation Age of Onset  . Deep vein thrombosis Mother   . Heart failure Father   . Ovarian cancer Sister 46  . Lung cancer Brother 69  . Cancer Maternal Aunt        NOS    Social History    Socioeconomic History  . Marital status: Married    Spouse name: Not on file  . Number of children: Not on file  . Years of education: Not on file  . Highest education level: Not on file  Occupational History  . Not on file  Tobacco Use  . Smoking status: Former Smoker    Packs/day: 1.00    Types: Cigarettes    Quit date: 03/07/1978    Years since quitting: 41.3  . Smokeless tobacco: Never Used  Substance and Sexual Activity  . Alcohol use: Yes    Comment:  occasional  . Drug use: No  . Sexual activity: Not on file  Other Topics Concern  . Not on file  Social History Narrative  . Not on file   Social Determinants of Health   Financial Resource Strain:   . Difficulty of Paying Living Expenses:   Food Insecurity:   . Worried About Charity fundraiser in the Last Year:   . Arboriculturist in the Last Year:   Transportation Needs:   . Film/video editor (Medical):   Marland Kitchen Lack of Transportation (Non-Medical):   Physical Activity:   . Days of Exercise per Week:   . Minutes of Exercise per Session:   Stress:   . Feeling of Stress :   Social Connections:   . Frequency of Communication with Friends and Family:   . Frequency of Social Gatherings with Friends and Family:   . Attends Religious Services:   . Active Member of Clubs or Organizations:   . Attends Archivist Meetings:   Marland Kitchen Marital Status:     Review of Systems: A 12 point ROS discussed and pertinent positives are indicated in the HPI above.  All other systems are negative.  Review of Systems  Constitutional: Positive for activity change, appetite change and unexpected weight change. Negative for fever.  Respiratory: Negative for cough and shortness of breath.   Gastrointestinal: Positive for abdominal pain.  Musculoskeletal: Positive for back pain and gait problem.  Neurological: Positive for weakness.  Psychiatric/Behavioral: Negative for behavioral problems and confusion.    Vital Signs: BP  (!) 151/82   Pulse 98   Temp 97.7 F (36.5 C) (Oral)   Resp 17   Ht 6' (1.829 m)   Wt 181 lb (82.1 kg)   SpO2 95%   BMI 24.55 kg/m   Physical Exam Vitals reviewed.  Cardiovascular:     Rate and Rhythm: Normal rate. Rhythm irregular.     Heart sounds: Normal heart sounds.  Pulmonary:     Effort: Pulmonary effort is normal.     Breath sounds: Normal breath sounds.  Abdominal:     Palpations: Abdomen is soft.     Tenderness: There is abdominal tenderness.  Musculoskeletal:        General: Normal range of motion.  Skin:    General: Skin is warm and dry.  Neurological:     Mental Status: He is alert and oriented to person, place, and time.  Psychiatric:        Behavior:  Behavior normal.        Thought Content: Thought content normal.        Judgment: Judgment normal.     Imaging: NM PET Image Initial (PI) Skull Base To Thigh  Result Date: 07/03/2019 CLINICAL DATA:  Subsequent treatment strategy for large cell neuroendocrine carcinoma. Prior left upper lobectomy. Prior chemotherapy in 2019. Personal history of prostate cancer. EXAM: NUCLEAR MEDICINE PET SKULL BASE TO THIGH TECHNIQUE: 9.3 mCi F-18 FDG was injected intravenously. Full-ring PET imaging was performed from the skull base to thigh after the radiotracer. CT data was obtained and used for attenuation correction and anatomic localization. Fasting blood glucose: 104 mg/dl COMPARISON:  CT scan from 04/01/2019 and outside CT scan from 06/19/2019 FINDINGS: Mediastinal blood pool activity: SUV max 2.9 Liver activity: SUV max N/A NECK: Left level Ib lymph node 1.2 cm in short axis on image 39/4, maximum SUV 10.2. Left level IV lymph node 0.7 cm in short axis on image 45/4, maximum SUV 6.8. Incidental CT findings: Left greater than right common carotid atherosclerotic calcification. CHEST: AP window lymph node 0.7 cm in short axis on image 68/4 (stable), maximum SUV 5.4. Small left eccentric subcarinal lymph node 0.8 cm in short axis  on image 78/4, maximum SUV 6.7. Indistinct right infrahilar lymph node maximum SUV 4.5. Indistinct clustered pulmonary nodules at the left lung base measuring up to about 0.7 cm in diameter, maximum SUV 2.8. Other tiny scattered peripheral pulmonary nodules are present but are below sensitive PET-CT size thresholds, but appear to be new compared to 04/01/2019. These include a 6 mm posterior basal segment right lower lobe nodule on image 49/8. Incidental CT findings: Coronary, aortic arch, and branch vessel atherosclerotic vascular disease. Pacemaker noted. Moderate cardiomegaly. Small to moderate left pleural effusion nonspecific for transudative or exudative etiology. ABDOMEN/PELVIS: Extensive multifocal hypermetabolic activity in the liver. Representative region of segment 4 maximum SUV 14.0. Nodular contour of the liver. 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 This is present in all lobes of the liver. Innumerable confluent liver lesions are present. Lymph nodes along the anterior margin of the proximal stomach noted, mildly hypermetabolic, with questionable hypermetabolic activity within the stomach wall itself anteriorly, maximum SUV 13.7. Incidental CT findings: Aortoiliac atherosclerotic vascular disease. Cholecystectomy. Prostatectomy. SKELETON: Innumerable widespread multifocal lytic hypermetabolic lesions throughout the skeleton including the left skull base, spine, clavicles, sternum, ribs, bony pelvis, proximal femurs. Representative involvement of the left posterior iliac bone has very faint associated bony lucency and a maximum SUV of 9.0. For the most part the bony involvement is lucent. An index lesion involving the right L1 vertebral body and pedicle measures about 3.7 by 2.1 cm on image 118/4 and has a maximum SUV of 9.9. Incidental CT findings: No definite vertebral collapse despite the multifocal involvement especially striking at L1. IMPRESSION: 1. Widespread hypermetabolic  malignancy with extensive involvement of the liver and extensive lytic involvement of the skeleton. Small but hypermetabolic lymph nodes in the neck and chest compatible with malignancy. Small but new/enlarging pulmonary nodules are generally below sensitive PET-CT size thresholds and could be malignant or from superimposed infection. 2. Other imaging findings of potential clinical significance: Aortic Atherosclerosis (ICD10-I70.0). Coronary atherosclerosis. Moderate cardiomegaly. Small left pleural effusion. Prostatectomy. Electronically Signed   By: Van Clines M.D.   On: 07/03/2019 08:07   DG Outside Films Chest  Result Date: 06/24/2019 This examination belongs to an outside facility and is stored here for comparison purposes only.  Contact the originating outside  institution for any associated report or interpretation.  CT OUTSIDE FILMS BODY/ABD/PELVIS  Result Date: 06/24/2019 This examination belongs to an outside facility and is stored here for comparison purposes only.  Contact the originating outside institution for any associated report or interpretation.   Labs:  CBC: Recent Labs    09/21/18 0901 04/01/19 1002  WBC 9.3 8.9  HGB 16.1 15.9  HCT 50.5 48.6  PLT 202 232    COAGS: No results for input(s): INR, APTT in the last 8760 hours.  BMP: Recent Labs    09/21/18 0901 04/01/19 1002  NA 137 135  K 4.7 4.7  CL 102 99  CO2 27 28  GLUCOSE 103* 100*  BUN 17 18  CALCIUM 9.4 9.3  CREATININE 1.16 1.21  GFRNONAA >60 58*  GFRAA >60 >60    LIVER FUNCTION TESTS: Recent Labs    09/21/18 0901 04/01/19 1002  BILITOT 0.8 1.3*  AST 17 20  ALT 15 16  ALKPHOS 82 78  PROT 7.4 7.2  ALBUMIN 4.3 4.4    TUMOR MARKERS: No results for input(s): AFPTM, CEA, CA199, CHROMGRNA in the last 8760 hours.  Assessment and Plan:  Hx Neuroendocrine tumor lung Previous chemotherapy 2019 New abd pain; back pain; shoulder and Rt rib pain Imaging revealing mets to liver; lymph  and bone Scheduled now for left submandibular LN bx Risks and benefits of left submandibular LN biopsy was discussed with the patient and/or patient's family including, but not limited to bleeding, infection, damage to adjacent structures or low yield requiring additional tests.  All of the questions were answered and there is agreement to proceed. Consent signed and in chart.   Thank you for this interesting consult.  I greatly enjoyed meeting Douglas Baxter and look forward to participating in their care.  A copy of this report was sent to the requesting provider on this date.  Electronically Signed: Lavonia Drafts, PA-C 07/08/2019, 11:49 AM   I spent a total of  30 Minutes   in face to face in clinical consultation, greater than 50% of which was counseling/coordinating care for left submandibular LN bx

## 2019-07-08 NOTE — Discharge Instructions (Addendum)

## 2019-07-09 LAB — SURGICAL PATHOLOGY

## 2019-07-11 ENCOUNTER — Inpatient Hospital Stay (HOSPITAL_COMMUNITY): Payer: Medicare HMO | Attending: Hematology | Admitting: Hematology

## 2019-07-11 ENCOUNTER — Encounter (HOSPITAL_COMMUNITY): Payer: Self-pay | Admitting: Hematology

## 2019-07-11 ENCOUNTER — Other Ambulatory Visit: Payer: Self-pay

## 2019-07-11 VITALS — BP 116/76 | HR 84 | Temp 96.9°F | Resp 18 | Wt 174.9 lb

## 2019-07-11 DIAGNOSIS — Z5112 Encounter for antineoplastic immunotherapy: Secondary | ICD-10-CM | POA: Insufficient documentation

## 2019-07-11 DIAGNOSIS — Z5111 Encounter for antineoplastic chemotherapy: Secondary | ICD-10-CM | POA: Insufficient documentation

## 2019-07-11 DIAGNOSIS — Z79899 Other long term (current) drug therapy: Secondary | ICD-10-CM | POA: Diagnosis not present

## 2019-07-11 DIAGNOSIS — Z5189 Encounter for other specified aftercare: Secondary | ICD-10-CM | POA: Insufficient documentation

## 2019-07-11 DIAGNOSIS — R7401 Elevation of levels of liver transaminase levels: Secondary | ICD-10-CM | POA: Insufficient documentation

## 2019-07-11 DIAGNOSIS — C7951 Secondary malignant neoplasm of bone: Secondary | ICD-10-CM | POA: Diagnosis not present

## 2019-07-11 DIAGNOSIS — C787 Secondary malignant neoplasm of liver and intrahepatic bile duct: Secondary | ICD-10-CM | POA: Diagnosis not present

## 2019-07-11 DIAGNOSIS — C3490 Malignant neoplasm of unspecified part of unspecified bronchus or lung: Secondary | ICD-10-CM | POA: Insufficient documentation

## 2019-07-11 DIAGNOSIS — C7A8 Other malignant neuroendocrine tumors: Secondary | ICD-10-CM

## 2019-07-11 MED ORDER — FENTANYL 25 MCG/HR TD PT72: 1.0000 | MEDICATED_PATCH | TRANSDERMAL | 0 refills | Status: DC

## 2019-07-11 NOTE — Patient Instructions (Signed)
[place] at [facility] Discharge Instructions  You were seen today by Dr. Delton Coombes. 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 With these results we need to start treatment right away. The treatment will be the same as last time but with Immunotherapy added to it. You will need 4-6 treatments. The treatment will be given 3 days in a row. This will help with your pain. He will start this treatment Monday.  Thank you for choosing [place] at [facility] to provide your oncology and hematology care.  To afford each patient quality time with our provider, please arrive at least 15 minutes before your scheduled appointment time.   If you have a lab appointment with the [facility] please come in thru the  Main Entrance and check in at the main information desk  You need to re-schedule your appointment should you arrive 10 or more minutes late.  We strive to give you quality time with our providers, and arriving late affects you and other patients whose appointments are after yours.  Also, if you no show three or more times for appointments you may be dismissed from the clinic at the providers discretion.     Again, thank you for choosing [facility].  Our hope is that these requests will decrease the amount of time that you wait before being seen by our physicians.       _____________________________________________________________  Should you have questions after your visit to [facility], please contact our office at [phone] between the hours of 8:00 a.m. and 4:30 p.m.  Voicemails left after 4:00 p.m. will not be returned until the following business day.  For prescription refill requests, have your pharmacy contact our office and allow 72 hours.    Cancer Center Support Programs:   > Cancer Support Group  2nd Tuesday of the month 1pm-2pm, Journey  Room

## 2019-07-11 NOTE — Progress Notes (Signed)
ALERT: A disease instance has been permanently removed from this patient's pathway record and replaced with a new disease instance. Information on the new disease instance will be transmitted in a separate message.  Disease Being Removed: [Other Dx]  Reason for Removal: Previous diagnosis has morphed into a different diagnosis

## 2019-07-11 NOTE — Progress Notes (Signed)
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  PRIOR THERAPY: 1.  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  NGS Results: Not done  CURRENT THERAPY: Carboplatin, etoposide and atezolizumab.  BRIEF ONCOLOGIC HISTORY:  Oncology History  Prostate cancer ([place])  06/24/2013 Initial Diagnosis   Prostate cancer ([place])   02/22/2018 Genetic Testing   Negative genetic testing on the common hereditary cancer panel.  The Hereditary Gene Panel offered by [facility] includes sequencing and/or deletion duplication testing of the following 47 genes: APC, ATM, AXIN2, BARD1, BMPR1A, BRCA1, BRCA2, BRIP1, CDH1, CDK4, CDKN2A (p14ARF), CDKN2A (p16INK4a), CHEK2, CTNNA1, DICER1, EPCAM (Deletion/duplication testing only), GREM1 (promoter region deletion/duplication testing only), KIT, MEN1, MLH1, MSH2, MSH3, MSH6, MUTYH, NBN, NF1, NHTL1, PALB2, PDGFRA, PMS2, POLD1, POLE, PTEN, RAD50, RAD51C, RAD51D, RET, SDHB, SDHC, SDHD, SMAD4, SMARCA4. STK11, TP53, TSC1, TSC2, and VHL.  The following genes were evaluated for sequence changes only: SDHA and HOXB13 c.251G>A variant only. The report date is February 22, 2018.   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[place])  07/27/2017 PET scan   Left lower lobe lung mass with SUV of 14.6, probable metastatic left hilar lymph node, no evidence of distant metastasis   08/17/2017 Surgery   VATS left upper lobe lobectomy, mediastinal lymphadenectomy, pathology  consistent with 2.6 cm x 2.1 x 2 cm large cell neuroendocrine carcinoma, station 5, station 6, station 11 lymph nodes not involved, 2/9 peribronchial lymph nodes positive for large cell neuroendocrine carcinoma, negative visceral pleural involvement   10/16/2017 - 01/08/2018 Chemotherapy   The patient had palonosetron (ALOXI) injection 0.25 mg, 0.25 mg, Intravenous,  Once, 4 of 4 cycles Administration: 0.25 mg (10/16/2017), 0.25 mg (11/07/2017), 0.25 mg (11/28/2017), 0.25 mg (12/19/2017) pegfilgrastim (NEULASTA ONPRO KIT) injection 6 mg, 6 mg, Subcutaneous, Once, 3 of 3 cycles Administration: 6 mg (10/18/2017), 6 mg (11/09/2017), 6 mg (11/30/2017) pegfilgrastim-cbqv (UDENYCA) injection 6 mg, 6 mg, Subcutaneous, Once, 1 of 1 cycle Administration: 6 mg (12/22/2017) CARBOplatin (PARAPLATIN) 500 mg in sodium chloride 0.9 % 250 mL chemo infusion, 500 mg (100 % of original dose 497 mg), Intravenous,  Once, 4 of 4 cycles Dose modification:   (original dose 497 mg, Cycle 1),   (original dose 467.5 mg, Cycle 2),   (original dose 470 mg, Cycle 3),   (original dose 437.5 mg, Cycle 4) Administration: 500 mg (10/16/2017), 470 mg (11/07/2017), 470 mg (11/28/2017), 440 mg (12/19/2017) etoposide (VEPESID) 200 mg in sodium chloride 0.9 % 500 mL chemo infusion, 90 mg/m2 = 220 mg, Intravenous,  Once, 4 of 4 cycles Administration: 200 mg (10/16/2017), 200 mg (10/17/2017), 200 mg (10/18/2017), 200 mg (11/07/2017), 200 mg (11/08/2017), 200 mg (11/09/2017), 200 mg (11/28/2017), 200 mg (11/29/2017), 200 mg (11/30/2017), 200 mg (12/19/2017), 200 mg (12/20/2017), 200 mg (12/21/2017) fosaprepitant (EMEND) 150 mg, dexamethasone (DECADRON) 12 mg in sodium chloride 0.9 % 145 mL IVPB, , Intravenous,  Once, 4 of 4 cycles Administration:  (10/16/2017),  (11/07/2017),  (11/28/2017),  (12/19/2017)  for chemotherapy treatment.    02/22/2018 Genetic Testing   Negative genetic testing  on the common hereditary cancer panel.  The Hereditary Gene Panel offered by  [facility] includes sequencing and/or deletion duplication testing of the following 47 genes: APC, ATM, AXIN2, BARD1, BMPR1A, BRCA1, BRCA2, BRIP1, CDH1, CDK4, CDKN2A (p14ARF), CDKN2A (p16INK4a), CHEK2, CTNNA1, DICER1, EPCAM (Deletion/duplication testing only), GREM1 (promoter region deletion/duplication testing only), KIT, MEN1, MLH1, MSH2, MSH3, MSH6, MUTYH, NBN, NF1, NHTL1, PALB2, PDGFRA, PMS2, POLD1, POLE, PTEN, RAD50, RAD51C, RAD51D, RET, SDHB, SDHC, SDHD, SMAD4, SMARCA4. STK11, TP53, TSC1, TSC2, and VHL.  The following genes were evaluated for sequence changes only: SDHA and HOXB13 c.251G>A variant only. The report date is February 22, 2018.   Small cell carcinoma of lung ([place])  07/11/2019 Initial Diagnosis   Small cell carcinoma of lung ([place])   07/15/2019 -  Chemotherapy   The patient had palonosetron (ALOXI) injection 0.25 mg, 0.25 mg, Intravenous,  Once, 0 of 4 cycles pegfilgrastim (NEULASTA ONPRO KIT) injection 6 mg, 6 mg, Subcutaneous, Once, 0 of 4 cycles CARBOplatin (PARAPLATIN) in sodium chloride 0.9 % 100 mL chemo infusion, , Intravenous,  Once, 0 of 4 cycles etoposide (VEPESID) 200 mg in sodium chloride 0.9 % 500 mL chemo infusion, 100 mg/m2, Intravenous,  Once, 0 of 4 cycles fosaprepitant (EMEND) 150 mg in sodium chloride 0.9 % 145 mL IVPB, 150 mg, Intravenous,  Once, 0 of 4 cycles atezolizumab (TECENTRIQ) 1,200 mg in sodium chloride 0.9 % 250 mL chemo infusion, 1,200 mg, Intravenous, Once, 0 of 8 cycles  for chemotherapy treatment.      CANCER STAGING: Cancer Staging No matching staging information was found for the patient.   INTERVAL HISTORY:  Douglas Baxter 79 y.o. male seen for follow-up of extensive metastatic cancer.  He had left submandibular lymph node biopsy done.  He is reporting pain in bilateral shoulders and back, 8 out of 10.  He is taking oxycodone 10 mg which is giving him pain relief for 3 to 4 hours.  He is taking anywhere between 3 to 5 tablets daily.  He also  complains of constipation.  He is taking lactulose once a day which did not help much.  He is having trouble eating as he does not have any appetite.  Denies any nausea or vomiting.    REVIEW OF SYSTEMS:  Review of Systems  HENT:   Positive for trouble swallowing.   Respiratory: Positive for shortness of breath.   Gastrointestinal: Positive for constipation.  Neurological: Positive for dizziness.  Psychiatric/Behavioral: Positive for sleep disturbance.  All other systems reviewed and are negative.    PAST MEDICAL/SURGICAL HISTORY:  Past Medical History:  Diagnosis Date  . Arthritis   . Cancer [facility])    prostate  . Family history of lung cancer   . Family history of ovarian cancer   . Hematuria   . Nocturia    Past Surgical History:  Procedure Laterality Date  . [place]   lower back  . CHOLECYSTECTOMY  2000  . LYMPHADENECTOMY Bilateral 06/24/2013   Procedure: LYMPHADENECTOMY;  Surgeon: [name], MD;  Location: [facility];  Service: Urology;  Laterality: Bilateral;  . MENISCUS REPAIR Right jan 2014  . PROSTATE BIOPSY  Apr 26, 2013  . ROBOT ASSISTED LAPAROSCOPIC RADICAL PROSTATECTOMY N/A 06/24/2013   Procedure: ROBOTIC ASSISTED LAPAROSCOPIC RADICAL PROSTATECTOMY LEVEL 2;  Surgeon: [name], MD;  Location: [facility];  Service: Urology;  Laterality: N/A;     SOCIAL HISTORY:  Social History   Socioeconomic History  . Marital status: Married    Spouse  name: Not on file  . Number of children: Not on file  . Years of education: Not on file  . Highest education level: Not on file  Occupational History  . Not on file  Tobacco Use  . Smoking status: Former Smoker    Packs/day: 1.00    Types: Cigarettes    Quit date: 03/07/1978    Years since quitting: 41.3  . Smokeless tobacco: Never Used  Substance and Sexual Activity  . Alcohol use: Yes    Comment:  occasional  . Drug use: No  . Sexual activity: Not on file  Other Topics Concern  . Not on file  Social  History Narrative  . Not on file   Social Determinants of Health   Financial Resource Strain:   . Difficulty of Paying Living Expenses:   Food Insecurity:   . Worried About Charity fundraiser in the Last Year:   . Arboriculturist in the Last Year:   Transportation Needs:   . Film/video editor (Medical):   Marland Kitchen Lack of Transportation (Non-Medical):   Physical Activity:   . Days of Exercise per Week:   . Minutes of Exercise per Session:   Stress:   . Feeling of Stress :   Social Connections:   . Frequency of Communication with Friends and Family:   . Frequency of Social Gatherings with Friends and Family:   . Attends Religious Services:   . Active Member of Clubs or Organizations:   . Attends Archivist Meetings:   Marland Kitchen Marital Status:   Intimate Partner Violence:   . Fear of Current or Ex-Partner:   . Emotionally Abused:   Marland Kitchen Physically Abused:   . Sexually Abused:     FAMILY HISTORY:  Family History  Problem Relation Age of Onset  . Deep vein thrombosis Mother   . Heart failure Father   . Ovarian cancer Sister 52  . Lung cancer Brother 105  . Cancer Maternal Aunt        NOS    CURRENT MEDICATIONS:  Outpatient Encounter Medications as of 07/11/2019  Medication Sig Note  . apixaban (ELIQUIS) 5 MG TABS tablet Take 5 mg by mouth 2 (two) times daily.   . furosemide (LASIX) 20 MG tablet Take 20 mg by mouth daily.    . metoprolol tartrate (LOPRESSOR) 25 MG tablet Take 25 mg by mouth daily.    . Oxycodone HCl 10 MG TABS Take 1 tablet (10 mg total) by mouth every 4 (four) hours as needed.   Vladimir Faster Glycol-Propyl Glycol (SYSTANE) 0.4-0.3 % GEL ophthalmic gel Place 1 application into both eyes in the morning and at bedtime.   . fentaNYL (DURAGESIC) 25 MCG/HR Place 1 patch onto the skin every 3 (three) days.   Marland Kitchen gabapentin (NEURONTIN) 300 MG capsule Take 1 capsule (300 mg total) by mouth 2 (two) times daily. (Patient not taking: Reported on 07/11/2019) 07/05/2019: Has  not started yet  . HYDROcodone-acetaminophen (NORCO/VICODIN) 5-325 MG tablet Take 1 tablet by mouth every 4 (four) hours as needed for moderate pain. (Patient not taking: Reported on 07/11/2019)   . Lactulose 20 GM/30ML SOLN Take 30 mLs (20 g total) by mouth at bedtime. (Patient not taking: Reported on 07/11/2019) 07/05/2019: Has not started yet  . magic mouthwash SOLN Take 5 mLs by mouth 4 (four) times daily. (Patient not taking: Reported on 07/11/2019)   . zolpidem (AMBIEN) 10 MG tablet TAKE 1 TABLET BY MOUTH EVERY DAY AT  BEDTIME AS NEEDED FOR SLEEP (Patient not taking: Reported on 07/11/2019)    No facility-administered encounter medications on file as of 07/11/2019.    ALLERGIES:  Allergies  Allergen Reactions  . Diltiazem   . Sulfa Antibiotics Nausea And Vomiting     PHYSICAL EXAM:  ECOG Performance status: 2  Vitals:   07/11/19 1418  BP: 116/76  Pulse: 84  Resp: 18  Temp: (!) 96.9 F (36.1 C)  SpO2: 94%   Filed Weights   07/11/19 1418  Weight: 174 lb 14.4 oz (79.3 kg)   Physical Exam Vitals reviewed.  Constitutional:      Appearance: Normal appearance.  HENT:     Head: Normocephalic and atraumatic.  Cardiovascular:     Rate and Rhythm: Normal rate and regular rhythm.     Heart sounds: Normal heart sounds.  Pulmonary:     Effort: Pulmonary effort is normal.     Breath sounds: Normal breath sounds.  Lymphadenopathy:     Cervical: Cervical adenopathy present.  Skin:    General: Skin is warm.  Neurological:     General: No focal deficit present.     Mental Status: He is alert and oriented to person, place, and time.  Psychiatric:        Mood and Affect: Mood normal.        Behavior: Behavior normal.      LABORATORY DATA:  I have reviewed the labs as listed.  CBC    Component Value Date/Time   WBC 7.4 07/08/2019 1207   RBC 4.79 07/08/2019 1207   HGB 14.3 07/08/2019 1207   HCT 43.3 07/08/2019 1207   PLT 256 07/08/2019 1207   MCV 90.4 07/08/2019 1207   MCH  29.9 07/08/2019 1207   MCHC 33.0 07/08/2019 1207   RDW 14.3 07/08/2019 1207   LYMPHSABS 2.0 04/01/2019 1002   MONOABS 0.7 04/01/2019 1002   EOSABS 0.1 04/01/2019 1002   BASOSABS 0.0 04/01/2019 1002   CMP Latest Ref Rng & Units 04/01/2019 09/21/2018 05/18/2018  Glucose 70 - 99 mg/dL 100(H) 103(H) 123(H)  BUN 8 - 23 mg/dL _0 Creatinine 0.61 - 1.24 mg/dL 1.21 1.16 1.45(H)  Sodium 135 - 145 mmol/L 135 137 136  Potassium 3.5 - 5.1 mmol/L 4.7 4.7 4.1  Chloride 98 - 111 mmol/L 99 102 104  CO2 22 - 32 mmol/L _1 Calcium 8.9 - 10.3 mg/dL 9.3 9.4 9.3  Total Protein 6.5 - 8.1 g/dL 7.2 7.4 7.5  Total Bilirubin 0.3 - 1.2 mg/dL 1.3(H) 0.8 1.5(H)  Alkaline Phos 38 - 126 U/L 78 82 79  AST 15 - 41 U/L _2 ALT 0 - 44 U/L _3 DIAGNOSTIC IMAGING:  I have independently reviewed the scans and discussed with the patient.  ASSESSMENT & PLAN:  Small cell carcinoma of lung ([place]) 1.  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  Small but hypermetabolic lymph nodes in the neck.  Small but new/enlarging pulmonary nodules below since 2 PET CT size. -Biopsy of the left submandibular lymph node consistent with metastatic neuroendocrine carcinoma consistent with small cell carcinoma. -He had a history of large cell neuroendocrine carcinoma of the lung, underwent VATS left upper lobectomy and mediastinal lymphadenectomy on 08/17/2017, 2/9 peribronchial lymph nodes positive, negative visceral pleural involvement, treated with 4 cycles of adjuvant chemotherapy with  carboplatin and VP-16 from 10/16/2017 through 12/19/2017. -We talked about the prognosis and treatment intent in the palliative setting. -Treatment will likely help his pain control and loss of appetite. -I have recommended first-line treatment with carboplatin, VP-16 and atezolizumab. -We  discussed the side effects in detail.  He will receive Neulasta on pro on day 3. -We will have to dose reduce based on his previous tolerability. -I will consider CT scan of the brain with and without contrast.  We will likely start him on therapy as soon as next Monday.  We will plan for port placement at a later time.  2.  Back pain/left shoulder pain/right-sided rib pain: -He is taking oxycodone 10 mg 3-5 tablets daily. -Pain is controlled for 3 to 4 hours after each tablet. -He continues to have pain upon movement. -I will start him on fentanyl patch 25 mcg.  He will use oxycodone 10 mg as needed.  3.  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 -We will check a PSA level and make sure this is not a transformation from prostate cancer.  4.  Atrial fibrillation: -He has a pacemaker.  He will continue Eliquis twice daily without any bleeding complications.  5.  Insomnia: -He will use Ambien 10 mg half tablet 2 times a night as needed.  6.  Family history: -Brother died of lung cancer and is a smoker.  Sister died of ovarian cancer.  Genetic testing was negative.  7.  Constipation: -He will use stool softeners.  I have given him lactulose at last visit.  He has been taking once or twice a day which did not help completely.  I have told him to increase it to every 3 hours until bowel movement followed by 3 times a day.  8.  Nutrition: -He is not eating much.  He is losing weight.  He does not like drinking Ensure or boost. -We have given boost smoothe samples in our office.  He was encouraged to drink 5 of those daily.  Each 1 has 300 cal.     Orders placed this encounter:  Orders Placed This Encounter  Procedures  . MR Brain W Wo Contrast  . Uric acid  . CBC with Differential/Platelet  . Comprehensive metabolic panel  . Uric acid   Total time spent is 40 minutes with more than 50% of the time spent face-to-face  discussing biopsy results, prognosis, treatment plan, side effects, counseling and coordination of care.   Derek Jack, MD [place] [phone]

## 2019-07-11 NOTE — Progress Notes (Signed)
START ON PATHWAY REGIMEN - Small Cell Lung     Cycles 1 through 4, every 21 days:     Atezolizumab      Carboplatin      Etoposide    Cycles 5 and beyond, every 21 days:     Atezolizumab   **Always confirm dose/schedule in your pharmacy ordering system**  Patient Characteristics: Newly Diagnosed, Preoperative or Nonsurgical Candidate (Clinical Staging), First Line, Extensive Stage Therapeutic Status: Newly Diagnosed, Preoperative or Nonsurgical Candidate (Clinical Staging) AJCC T Category: cTX AJCC N Category: cNX AJCC M Category: pM1c AJCC 8 Stage Grouping: IVB Stage Classification: Extensive Intent of Therapy: Non-Curative / Palliative Intent, Discussed with Patient

## 2019-07-11 NOTE — Assessment & Plan Note (Addendum)
1.  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  Small but hypermetabolic lymph nodes in the neck.  Small but new/enlarging pulmonary nodules below since 2 PET CT size. -Biopsy of the left submandibular lymph node consistent with metastatic neuroendocrine carcinoma consistent with small cell carcinoma. -He had a history of large cell neuroendocrine carcinoma of the lung, underwent VATS left upper lobectomy and mediastinal lymphadenectomy on 08/17/2017, 2/9 peribronchial lymph nodes positive, negative visceral pleural involvement, treated with 4 cycles of adjuvant chemotherapy with carboplatin and VP-16 from 10/16/2017 through 12/19/2017. -We talked about the prognosis and treatment intent in the palliative setting. -Treatment will likely help his pain control and loss of appetite. -I have recommended first-line treatment with carboplatin, VP-16 and atezolizumab. -We discussed the side effects in detail.  He will receive Neulasta on pro on day 3. -We will have to dose reduce based on his previous tolerability. -I will consider CT scan of the brain with and without contrast.  We will likely start him on therapy as soon as next Monday.  We will plan for port placement at a later time.  2.  Back pain/left shoulder pain/right-sided rib pain: -He is taking oxycodone 10 mg 3-5 tablets daily. -Pain is controlled for 3 to 4 hours after each tablet. -He continues to have pain upon movement. -I will start him on fentanyl patch 25 mcg.  He will use oxycodone 10 mg as needed.  3.  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 -We will check a PSA level and make sure this is not a transformation from prostate cancer.  4.  Atrial fibrillation: -He has a pacemaker.  He will continue Eliquis twice daily without any  bleeding complications.  5.  Insomnia: -He will use Ambien 10 mg half tablet 2 times a night as needed.  6.  Family history: -Brother died of lung cancer and is a smoker.  Sister died of ovarian cancer.  Genetic testing was negative.  7.  Constipation: -He will use stool softeners.  I have given him lactulose at last visit.  He has been taking once or twice a day which did not help completely.  I have told him to increase it to every 3 hours until bowel movement followed by 3 times a day.  8.  Nutrition: -He is not eating much.  He is losing weight.  He does not like drinking Ensure or boost. -We have given boost smoothe samples in our office.  He was encouraged to drink 5 of those daily.  Each 1 has 300 cal.

## 2019-07-12 ENCOUNTER — Telehealth: Payer: Self-pay

## 2019-07-12 ENCOUNTER — Other Ambulatory Visit (HOSPITAL_COMMUNITY): Payer: Self-pay | Admitting: *Deleted

## 2019-07-12 DIAGNOSIS — C3490 Malignant neoplasm of unspecified part of unspecified bronchus or lung: Secondary | ICD-10-CM

## 2019-07-12 MED ORDER — PROCHLORPERAZINE MALEATE 10 MG PO TABS
10.0000 mg | ORAL_TABLET | Freq: Four times a day (QID) | ORAL | 1 refills | Status: AC | PRN
Start: 2019-07-12 — End: ?

## 2019-07-12 NOTE — Patient Instructions (Signed)
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  You will be treated every 3 weeks with a combination of chemotherapy and immunotherapy drugs.  Those drugs are atezolizumab (Tecentriq), carboplatin, and etoposide (VP-16).  You will receive all three drugs x 4 cycles, then after 4 cycles you will be treated every 3 weeks with Tecentriq only as maintenance therapy. The intent of treatment is to control your cancer, prevent it from spreading further, and to help alleviate any symptoms you may be having related to your disease. You will see the doctor regularly throughout treatment.  We will obtain blood work from you prior to every treatment and monitor your results to make sure it is safe to give your treatment. The doctor monitors your response to treatment by the way you are feeling, your blood work, and by obtaining scans periodically.  There will be wait times while you are here for treatment.  It will take about 30 minutes to 1 hour for your lab work to result.  Then there will be wait times while pharmacy mixes your medications.    Atezolizumab Gildardo Pounds)  About This Drug [name] is used to treat cancer. It is given by the vein (IV).  It will take 30 minutes to infuse.   Possible Side Effects . Nausea . Tiredness and weakness . Decreased appetite (decreased hunger) . Cough . Trouble breathing  Note: Each of the side effects above was reported in 20% or greater of patients treated with atezolizumab. Your side effects may be different if you are taking atezolizumab in combination with another agent. Not all possible side effects are included above.  Warnings and Precautions . This drug works with your immune system and can cause inflammation (swelling) in any of your organs and tissues and can change how they work. This may put you at risk for developing serious medical problems, which can be  life-threatening.  . Inflammation of the lungs which can be life-threatening. You may have a dry cough or trouble breathing.  . Severe changes in your liver function which can cause liver failure and be life-threatening.  . Colitis which is swelling in the colon. The symptoms are diarrhea, stomach cramping, and sometimes blood in the bowel movements.  . Changes in your central nervous system can happen. The central nervous system is made up of your brain and spinal cord. You could feel extreme tiredness, agitation, confusion, hallucinations (see or hear things that are not there), trouble understanding or speaking, loss of control of your bowels or bladder, eyesight changes, numbness or lack of strength to your arms, legs, face, or body, and coma. If you start to have any of these symptoms let your doctor know right away.  . This drug may affect your hormone glands (thyroid, adrenals, pituitary and pancreas).  . Blood sugar levels may change, and you may develop diabetes. If you already have diabetes, changes may need to be made to your diabetes medication.  . Severe infections, including viral, bacterial and fungal, which can be life-threatening  . While you are getting this drug in your vein (IV), you may have a reaction to the drug. Sometimes you may be given medication to stop or lessen these side effects. Your nurse will check you closely for these signs: fever or shaking chills, flushing, facial swelling, feeling dizzy, headache, trouble breathing, rash, itching, chest tightness, or chest pain. These reactions may happen after your infusion. If  this happens, call 911 for emergency care.  Note: Some of the side effects above are very rare. If you have concerns and/or questions, please discuss them with your medical team.  Important Information . This drug may be present in the saliva, tears, sweat, urine, stool, vomit, semen, and vaginal secretions. Talk to your doctor and/or your nurse  about the necessary precautions to take during this time.   Treating Side Effects  . Manage tiredness by pacing your activities for the day.  . Be sure to include periods of rest between energy-draining activities.  . To decrease the risk of infection, wash your hands regularly.  . Avoid close contact with people who have a cold, the flu, or other infections.  . Take your temperature as your doctor or nurse tells you, and whenever you feel like you may have a fever.  . Drink plenty of fluids (a minimum of eight glasses per day is recommended).  . Ask your doctor or nurse about medicine that is available to help stop or lessen loose bowel movements.  . To help with nausea, eat small, frequent meals instead of three large meals a day. Choose foods and drinks that are at room temperature. Ask your nurse or doctor about other helpful tips and medicine that is available to help stop or lessen these symptoms.   . To help with decreased appetite, eat small, frequent meals. Eat foods high in calories and protein, such as meat, poultry, fish, dry beans, tofu, eggs, nuts, milk, yogurt, cheese, ice cream, pudding, and nutritional supplements.  . Consider using sauces and spices to increase taste. Daily exercise, with your doctor's approval, may increase your appetite.  . If you have diabetes, keep good control of your blood sugar level. Tell your nurse or your doctor if your glucose levels are higher or lower than normal.  . Keeping your pain under control is important to your well-being. Please tell your doctor or nurse if you are experiencing pain.  . If you get a rash do not put anything on it unless your doctor or nurse says you may. Keep the area around the rash clean and dry. Ask your doctor for medicine if your rash bothers you.  . If you have numbness and tingling in your hands and feet, be careful when cooking, walking, and handling sharp objects and hot liquids.  . Infusion reactions  may happen after your infusion. If this happens, call 911 for emergency care.  Food and Drug Interactions . There are no known interactions of atezolizumab with food.  . This drug may interact with other medicines. Tell your doctor and pharmacist about all the prescription and over-the-counter medicines and dietary supplements (vitamins, minerals, herbs and others) that you are taking at this time. Also, check with your doctor or pharmacist before starting any new prescription or over-the-counter medicines, or dietary supplements to make sure that there are no interactions.  When to Call the Doctor  Call your doctor or nurse if you have any of these symptoms and/or any new or unusual symptoms:  . Fever of 100.4 F (38 C) or higher  . Chills  . Tiredness that interferes with your daily activities  . Feeling dizzy or lightheaded  . Pain in your chest  . Dry cough  . Coughing up yellow, green, or bloody mucus  . Wheezing or trouble breathing  . Feeling that your heart is beating in a fast or not normal way (palpitations)  . Confusion and/or agitation  .  Hallucinations  . Trouble understanding or speaking  . Numbness or lack of strength to your arms, legs, face, or body  . Blurred vision or other changes in eyesight  . Diarrhea, 4 times in one day or diarrhea with lack of strength or a feeling of being dizzy  . Pain in your abdomen that does not go away  . Blood in your stool  . Nausea that stops you from eating or drinking and/or is not relieved by prescribed medicines  . Throwing up  . Lasting loss of appetite or rapid weight loss of five pounds in a week  . Abnormal blood sugar  . Unusual thirst, passing urine often, headache, sweating, shakiness, irritability  . Pain that does not go away, or is not relieved by prescribed medicines  . Numbness, tingling, or pain your hands and feet  . Extreme weakness that interferes with normal activities  . A new rash  or a rash that is not relieved by prescribed medicines  . Signs of infusion reaction: fever or shaking chills, flushing, facial swelling, feeling dizzy, headache, trouble breathing, rash, itching, chest tightness, or chest pain. If this happens, call 911 for emergency care.  . Signs of possible liver problems: dark urine, pale bowel movements, bad stomach pain, feeling very tired and weak, unusual itching, or yellowing of the eyes or skin  . If you think you may be pregnant  Reproduction Warnings  . Pregnancy warning: This drug can have harmful effects on the unborn baby. Women of childbearing potential should use effective methods of birth control during your cancer treatment and for at least 5 months after treatment. Let your doctor know right away if you think you may be pregnant.  . Breastfeeding warning: It is not known if this drug passes into breast milk. For this reason, Women should not breastfeed during treatment and for at least 5 months after treatment because this drug could enter the breast milk and cause harm to a breastfeeding baby.  . Fertility warning: In women, this drug may affect your ability to have children in the future. Talk with your doctor or nurse if you plan to have children. Ask for information on egg banking.  Carboplatin (Paraplatin, CBDCA)  About This Drug  Carboplatin is used to treat cancer. It is given in the vein (IV).  This drug will take 30 minutes to infuse.   Possible Side Effects  . Bone marrow suppression. This is a decrease in the number of white blood cells, red blood cells, and platelets. This may raise your risk of infection, make you tired and weak (fatigue), and raise your risk of bleeding.  . Nausea and vomiting (throwing up)  . Weakness  . Changes in your liver function  . Changes in your kidney function  . Electrolyte changes  . Pain  Note: Each of the side effects above was reported in 20% or greater of patients treated with  carboplatin. Not all possible side effects are included above.  Warnings and Precautions  . Severe bone marrow suppression  . Allergic reactions, including anaphylaxis are rare but may happen in some patients. Signs of allergic reaction to this drug may be swelling of the face, feeling like your tongue or throat are swelling, trouble breathing, rash, itching, fever, chills, feeling dizzy, and/or feeling that your heart is beating in a fast or not normal way. If this happens, do not take another dose of this drug. You should get urgent medical treatment.  . Severe nausea  and vomiting  . Effects on the nerves are called peripheral neuropathy. This risk is increased if you are over the age of 23 or if you have received other medicine with risk of peripheral neuropathy. You may feel numbness, tingling, or pain in your hands and feet. It may be hard for you to button your clothes, open jars, or walk as usual. The effect on the nerves may get worse with more doses of the drug. These effects get better in some people after the drug is stopped but it does not get better in all people.  Marland Kitchen Blurred vision, loss of vision or other changes in eyesight  . Decreased hearing   - Skin and tissue irritation including redness, pain, warmth, or swelling at the IV site if the drug leaks out of the vein and into nearby tissue.  . Severe changes in your kidney function, which can cause kidney failure  . Severe changes in your liver function, which can cause liver failure  Note: Some of the side effects above are very rare. If you have concerns and/or questions, please discuss them with your medical team.  Important Information  . This drug may be present in the saliva, tears, sweat, urine, stool, vomit, semen, and vaginal secretions. Talk to your doctor and/or your nurse about the necessary precautions to take during this time.  Treating Side Effects  . Manage tiredness by pacing your activities for the  day.  . Be sure to include periods of rest between energy-draining activities.  . To decrease the risk of infection, wash your hands regularly.  . Avoid close contact with people who have a cold, the flu, or other infections.  . Take your temperature as your doctor or nurse tells you, and whenever you feel like you may have a fever.  . To help decrease the risk of bleeding, use a soft toothbrush. Check with your nurse before using dental floss.  . Be very careful when using knives or tools.  . Use an electric shaver instead of a razor.  . Drink plenty of fluids (a minimum of eight glasses per day is recommended).  . If you throw up or have loose bowel movements, you should drink more fluids so that you do not become dehydrated (lack of water in the body from losing too much fluid).  . To help with nausea and vomiting, eat small, frequent meals instead of three large meals a day. Choose foods and drinks that are at room temperature. Ask your nurse or doctor about other helpful tips and medicine that is available to help stop or lessen these symptoms.  . If you have numbness and tingling in your hands and feet, be careful when cooking, walking, and handling sharp objects and hot liquids.  Marland Kitchen Keeping your pain under control is important to your well-being. Please tell your doctor or nurse if you are experiencing pain.  Food and Drug Interactions . There are no known interactions of carboplatin with food.  . This drug may interact with other medicines. Tell your doctor and pharmacist about all the prescription and over-the-counter medicines and dietary supplements (vitamins, minerals, herbs and others) that you are taking at this time. Also, check with your doctor or pharmacist before starting any new prescription or over-the-counter medicines, or dietary supplements to make sure that there are no interactions.  When to Call the Doctor  Call your doctor or nurse if you have any of these  symptoms and/or any new or unusual symptoms:  .  Fever of 100.4 F (38 C) or higher  . Chills  . Tiredness that interferes with your daily activities  . Feeling dizzy or lightheaded  . Easy bleeding or bruising  . Nausea that stops you from eating or drinking and/or is not relieved by prescribed medicines  . Throwing up  . Blurred vision or other changes in eyesight  . Decrease in hearing or ringing in the ear  . Signs of allergic reaction: swelling of the face, feeling like your tongue or throat are swelling, trouble breathing, rash, itching, fever, chills, feeling dizzy, and/or feeling that your heart is beating in a fast or not normal way. If this happens, call 911 for emergency care.  . Signs of possible liver problems: dark urine, pale bowel movements, bad stomach pain, feeling very tired and weak, unusual itching, or yellowing of the eyes or skin  . Decreased urine, or very dark urine  . Numbness, tingling, or pain in your hands and feet  . Pain that does not go away or is not relieved by prescribed medicine  . While you are getting this drug, please tell your nurse right away if you have any pain, redness, or swelling at the site of the IV infusion, or if you have any new onset of symptoms, or if you just feel "different" from before when the infusion was started.   Reproduction Warnings  . Pregnancy warning: This drug may have harmful effects on the unborn baby. Women of child bearing potential should use effective methods of birth control during your cancer treatment. Let your doctor know right away if you think you may be pregnant.  . Breastfeeding warning: It is not known if this drug passes into breast milk. For this reason, women should not breastfeed during treatment because this drug could enter the breast milk and cause harm to a breastfeeding baby.  . Fertility warning: Human fertility studies have not been done with this drug. Talk with your doctor or nurse if  you plan to have children. Ask for information on sperm or egg banking.  Etoposide  About This Drug  Etoposide is used to treat cancer. It is given in the vein (IV).  This drug will take 1 hour to infuse.    Possible Side Effects  . Bone marrow suppression. This is a decrease in the number of white blood cells, red blood cells, and platelets. This may raise your risk of infection, make you tired and weak (fatigue), and raise your risk of bleeding.  . Nausea and vomiting (throwing up)  . Hair loss. Hair loss is often temporary, although with certain medicine, hair loss can sometimes be permanent. Hair loss may happen suddenly or gradually. If you lose hair, you may lose it from your head, face, armpits, pubic area, chest, and/or legs. You may also notice your hair getting thin.  Note: Each of the side effects above was reported in 20% or greater of patients treated with etoposide. Not all possible side effects are included above.  Warnings and Precautions  . Severe bone marrow suppression, which may be life-threatening  . This drug may raise your risk of getting a second cancer, such as leukemia  . Low blood pressure with rapid infusion of the medication  . Allergic reactions, including anaphylaxis are rare but may happen in some patients. Signs of allergic reaction to this drug may be swelling of the face, feeling like your tongue or throat are swelling, trouble breathing, rash, itching, fever, chills, feeling dizzy,  and/or feeling that your heart is beating in a fast or not normal way. If this happens, do not take another dose of this drug. You should get urgent medical treatment.  . These side effects may be more severe if you are receiving high doses of this medication included in pre-transplant chemotherapy.  Treating Side Effects  . Manage tiredness by pacing your activities for the day.  . Be sure to include periods of rest between energy-draining activities.  . To decrease  the risk of infection, wash your hands regularly.  . Avoid close contact with people who have a cold, the flu, or other infections.  . Take your temperature as your doctor or nurse tells you, and whenever you feel like you may have a fever.  . To help decrease bleeding, use a soft toothbrush. Check with your nurse before using dental floss.  . Be very careful when using knives or tools.  . Use an electric shaver instead of a razor.  . Drink plenty of fluids (a minimum of eight glasses per day is recommended).  . If you throw up or have loose bowel movements, you should drink more fluids so that you do not become dehydrated (lack of water in the body from losing too much fluid).  . To help with nausea and vomiting, eat small, frequent meals instead of three large meals a day. Choose foods and drinks that are at room temperature. Ask your nurse or doctor about other helpful tips and medicine that is available to help or stop lessen these symptoms.  . To help with hair loss, wash with a mild shampoo and avoid washing your hair every day.  . Avoid rubbing your scalp, pat your hair or scalp dry.  . Avoid coloring your hair.  . Limit your use of hair spray, electric curlers, blow dryers, and curling irons.  . If you are interested in getting a wig, talk to your nurse. You can also call the [employer] at [phone]-2345 to find out information about the "Look Good, Feel Better" program close to where you live. It is a free program where women getting chemotherapy can learn about wigs, turbans and scarves as well as makeup techniques and skin and nail care.  Food and Drug Interactions  . There are no known interactions of etoposide with food.  . This drug may interact with other medicines. Tell your doctor and pharmacist about all the medicines and dietary supplements (vitamins, minerals, herbs and others) that you are taking at this time. The safety and use of dietary supplements  and alternative diets are often not known. Using these might affect your cancer or interfere with your treatment. Until more is known, you should not use dietary supplements or alternative diets without your cancer doctor's help.  . There are known interactions of etoposide with blood thinning medicine such as warfarin. Ask your doctor what precautions you should take.  When to Call the Doctor  Call your doctor or nurse if you have any of these symptoms and/or any new or unusual symptoms:  . Fever of 100.4 F (38 C) or higher  . Chills  . Tiredness that interferes with your daily activities  . Feeling dizzy or lightheaded  . Feeling that your heart is beating in a fast or not normal way (palpitations)  . Easy bleeding or bruising  . Nausea that stops you from eating or drinking and/or is not relieved by prescribed medicines  . Throwing up  . Signs  of allergic reaction: swelling of the face, feeling like your tongue or throat are swelling, trouble breathing, rash, itching, fever, chills, feeling dizzy, and/or feeling that your heart is beating in a fast or not normal way  . If you think you may be pregnant or may have impregnated your partner  Reproduction Warnings  . Pregnancy warning: This drug can have harmful effects on the unborn baby. Women of childbearing potential should use effective methods of birth control during your cancer treatment and for at least 6 months after treatment. Men with male partners of childbearing potential should use effective methods of birth control during your cancer treatment and for at least 4 months after your cancer treatment. Let your doctor know right away if you think you may be pregnant or may have impregnated your partner.  . Breastfeeding warning: It is not known if this drug passes into breast milk. For this reason, women should talk to their doctor about the risks and benefits of breastfeeding during treatment with this drug because this  drug may enter the breast milk and cause harm to a breastfeeding baby.  . Fertility warning: In men and women both, this drug may affect your ability to have children in the future. Talk with your doctor or nurse if you plan to have children. Ask for information on sperm or egg banking.   SELF CARE ACTIVITIES WHILE RECEIVING CHEMOTHERAPY:  Hydration Increase your fluid intake 48 hours prior to treatment and drink at least 8 to 12 cups (64 ounces) of water/decaffeinated beverages per day after treatment. You can still have your cup of coffee or soda but these beverages do not count as part of your 8 to 12 cups that you need to drink daily. No alcohol intake.  Medications Continue taking your normal prescription medication as prescribed.  If you start any new herbal or new supplements please let us know first to make sure it is safe.  Mouth Care Have teeth cleaned professionally before starting treatment. Keep dentures and partial plates clean. Use soft toothbrush and do not use mouthwashes that contain alcohol. Biotene is a good mouthwash that is available at most pharmacies or may be ordered by calling [phone]. Use warm salt water gargles (1 teaspoon salt per 1 quart warm water) before and after meals and at bedtime. If you need dental work, please let the doctor know before you go for your appointment so that we can coordinate the best possible time for you in regards to your chemo regimen. You need to also let your dentist know that you are actively taking chemo. We may need to do labs prior to your dental appointment.  Skin Care Always use sunscreen that has not expired and with SPF (Sun Protection Factor) of 50 or higher. Wear hats to protect your head from the sun. Remember to use sunscreen on your hands, ears, face, & feet.  Use good moisturizing lotions such as udder cream, eucerin, or even Vaseline. Some chemotherapies can cause dry skin, color changes in your skin and nails.     . Avoid long, hot showers or baths. . Use gentle, fragrance-free soaps and laundry detergent. . Use moisturizers, preferably creams or ointments rather than lotions because the thicker consistency is better at preventing skin dehydration. Apply the cream or ointment within 15 minutes of showering. Reapply moisturizer at night, and moisturize your hands every time after you wash them.  Hair Loss (if your doctor says your hair will fall out)  . If  your doctor says that your hair is likely to fall out, decide before you begin chemo whether you want to wear a wig. You may want to shop before treatment to match your hair color. . Hats, turbans, and scarves can also camouflage hair loss, although some people prefer to leave their heads uncovered. If you go bare-headed outdoors, be sure to use sunscreen on your scalp. . Cut your hair short. It eases the inconvenience of shedding lots of hair, but it also can reduce the emotional impact of watching your hair fall out. . Don't perm or color your hair during chemotherapy. Those chemical treatments are already damaging to hair and can enhance hair loss. Once your chemo treatments are done and your hair has grown back, it's OK to resume dyeing or perming hair.  With chemotherapy, hair loss is almost always temporary. But when it grows back, it may be a different color or texture. In older adults who still had hair color before chemotherapy, the new growth may be completely gray.  Often, new hair is very fine and soft.  Infection Prevention Please wash your hands for at least 30 seconds using warm soapy water. Handwashing is the #1 way to prevent the spread of germs. Stay away from sick people or people who are getting over a cold. If you develop respiratory systems such as green/yellow mucus production or productive cough or persistent cough let us know and we will see if you need an antibiotic. It is a good idea to keep a pair of gloves on when going into  grocery stores/[employer] to decrease your risk of coming into contact with germs on the carts, etc. Carry alcohol hand gel with you at all times and use it frequently if out in public. If your temperature reaches 100.4 or higher please call the clinic and let us know.  If it is after hours or on the weekend please go to the ER if your temperature is over 100.4.  Please have your own personal thermometer at home to use.    Sex and bodily fluids If you are going to have sex, a condom must be used to protect the person that isn't taking chemotherapy. Chemo can decrease your libido (sex drive). For a few days after chemotherapy, chemotherapy can be excreted through your bodily fluids.  When using the toilet please close the lid and flush the toilet twice.  Do this for a few day after you have had chemotherapy.   Effects of chemotherapy on your sex life Some changes are simple and won't last long. They won't affect your sex life permanently.  Sometimes you may feel: . too tired . not strong enough to be very active . sick or sore  . not in the mood . anxious or low  Your anxiety might not seem related to sex. For example, you may be worried about the cancer and how your treatment is going. Or you may be worried about money, or about how you family are coping with your illness.  These things can cause stress, which can affect your interest in sex. It's important to talk to your partner about how you feel.  Remember - the changes to your sex life don't usually last long. There's usually no medical reason to stop having sex during chemo. The drugs won't have any long term physical effects on your performance or enjoyment of sex. Cancer can't be passed on to your partner during sex  Contraception It's important to use reliable contraception  during treatment. Avoid getting pregnant while you or your partner are having chemotherapy. This is because the drugs may harm the baby. Sometimes chemotherapy drugs can  leave a man or woman infertile.  This means you would not be able to have children in the future. You might want to talk to someone about permanent infertility. It can be very difficult to learn that you may no longer be able to have children. Some people find counselling helpful. There might be ways to preserve your fertility, although this is easier for men than for women. You may want to speak to a fertility expert. You can talk about sperm banking or harvesting your eggs. You can also ask about other fertility options, such as donor eggs. If you have or have had breast cancer, your doctor might advise you not to take the contraceptive pill. This is because the hormones in it might affect the cancer. It is not known for sure whether or not chemotherapy drugs can be passed on through semen or secretions from the vagina. Because of this some doctors advise people to use a barrier method if you have sex during treatment. This applies to vaginal, anal or oral sex. Generally, doctors advise a barrier method only for the time you are actually having the treatment and for about a week after your treatment. Advice like this can be worrying, but this does not mean that you have to avoid being intimate with your partner. You can still have close contact with your partner and continue to enjoy sex.  Animals If you have cats or birds we just ask that you not change the litter or change the cage.  Please have someone else do this for you while you are on chemotherapy.   Food Safety During and After Cancer Treatment Food safety is important for people both during and after cancer treatment. Cancer and cancer treatments, such as chemotherapy, radiation therapy, and stem cell/bone marrow transplantation, often weaken the immune system. This makes it harder for your body to protect itself from foodborne illness, also called food poisoning. Foodborne illness is caused by eating food that contains harmful bacteria, parasites,  or viruses.  Foods to avoid Some foods have a higher risk of becoming tainted with bacteria. These include: Marland Kitchen Unwashed fresh fruit and vegetables, especially leafy vegetables that can hide dirt and other contaminants . Raw sprouts, such as alfalfa sprouts . Raw or undercooked beef, especially ground beef, or other raw or undercooked meat and poultry . Fatty, fried, or spicy foods immediately before or after treatment.  These can sit heavy on your stomach and make you feel nauseous. . Raw or undercooked shellfish, such as oysters. . Sushi and sashimi, which often contain raw fish.  . Unpasteurized beverages, such as unpasteurized fruit juices, raw milk, raw yogurt, or cider . Undercooked eggs, such as soft boiled, over easy, and poached; raw, unpasteurized eggs; or foods made with raw egg, such as homemade raw cookie dough and homemade mayonnaise  Simple steps for food safety  Shop smart. . Do not buy food stored or displayed in an unclean area. . Do not buy bruised or damaged fruits or vegetables. . Do not buy cans that have cracks, dents, or bulges. . Pick up foods that can spoil at the end of your shopping trip and store them in a cooler on the way home.  Prepare and clean up foods carefully. . Rinse all fresh fruits and vegetables under running water, and dry them with a  clean towel or paper towel. . Clean the top of cans before opening them. . After preparing food, wash your hands for 20 seconds with hot water and soap. Pay special attention to areas between fingers and under nails. . Clean your utensils and dishes with hot water and soap. Marland Kitchen Disinfect your kitchen and cutting boards using 1 teaspoon of liquid, unscented bleach mixed into 1 quart of water.    Dispose of old food. . Eat canned and packaged food before its expiration date (the "use by" or "best before" date). . Consume refrigerated leftovers within 3 to 4 days. After that time, throw out the food. Even if the food does  not smell or look spoiled, it still may be unsafe. Some bacteria, such as Listeria, can grow even on foods stored in the refrigerator if they are kept for too long.  Take precautions when eating out. . At restaurants, avoid buffets and salad bars where food sits out for a long time and comes in contact with many people. Food can become contaminated when someone with a virus, often a norovirus, or another "bug" handles it. . Put any leftover food in a "to-go" container yourself, rather than having the server do it. And, refrigerate leftovers as soon as you get home. . Choose restaurants that are clean and that are willing to prepare your food as you order it cooked.   AT HOME MEDICATIONS:                                                                                                                                                                Compazine/Prochlorperazine 10mg  tablet. Take 1 tablet every 6 hours as needed for nausea/vomiting. (This can make you sleepy)   EMLA cream. Apply a quarter size amount to port site 1 hour prior to chemo. Do not rub in. Cover with plastic wrap.    Diarrhea Sheet   If you are having loose stools/diarrhea, please purchase Imodium and begin taking as outlined:  At the first sign of poorly formed or loose stools you should begin taking Imodium (loperamide) 2 mg capsules.  Take two tablets (4mg ) followed by one tablet (2mg ) every 2 hours - DO NOT EXCEED 8 tablets in 24 hours.  If it is bedtime and you are having loose stools, take 2 tablets at bedtime, then 2 tablets every 4 hours until morning.   Always call the [facility] if you are having loose stools/diarrhea that you can't get under control.  Loose stools/diarrhea leads to dehydration (loss of water) in your body.  We have other options of trying to get the loose stools/diarrhea to stop but you must let us know!   Constipation Sheet  Colace - 100 mg capsules - take 2 capsules daily.  If  this  doesn't help then you can increase to 2 capsules twice daily.  Please call if the above does not work for you. Do not go more than 2 days without a bowel movement.  It is very important that you do not become constipated.  It will make you feel sick to your stomach (nausea) and can cause abdominal pain and vomiting.  Nausea Sheet   Compazine/Prochlorperazine 10mg  tablet. Take 1 tablet every 6 hours as needed for nausea/vomiting (This can make you drowsy).  If you are having persistent nausea (nausea that does not stop) please call the [facility] and let us know the amount of nausea that you are experiencing.  If you begin to vomit, you need to call the [facility] and if it is the weekend and you have vomited more than one time and can't get it to stop-go to the Emergency Room.  Persistent nausea/vomiting can lead to dehydration (loss of fluid in your body) and will make you feel very weak and unwell. Ice chips, sips of clear liquids, foods that are at room temperature, crackers, and toast tend to be better tolerated.   SYMPTOMS TO REPORT AS SOON AS POSSIBLE AFTER TREATMENT:  FEVER GREATER THAN 100.4 F  CHILLS WITH OR WITHOUT FEVER  NAUSEA AND VOMITING THAT IS NOT CONTROLLED WITH YOUR NAUSEA MEDICATION  UNUSUAL SHORTNESS OF BREATH  UNUSUAL BRUISING OR BLEEDING  TENDERNESS IN MOUTH AND THROAT WITH OR WITHOUT PRESENCE OF ULCERS  URINARY PROBLEMS  BOWEL PROBLEMS  UNUSUAL RASH      Wear comfortable clothing and clothing appropriate for easy access to any Portacath or PICC line. Let us know if there is anything that we can do to make your therapy better!    What to do if you need assistance after hours or on the weekends: CALL [phone].  HOLD on the line, do not hang up.  You will hear multiple messages but at the end you will be connected with a nurse triage line.  They will contact the doctor if necessary.  Most of the time they will be able to assist you.  Do not call the  hospital operator.      I have been informed and understand all of the instructions given to me and have received a copy. I have been instructed to call the clinic [phone] or my family physician as soon as possible for continued medical care, if indicated. I do not have any more questions at this time but understand that I may call the [facility] or the Patient Navigator at [phone] during office hours should I have questions or need assistance in obtaining follow-up care.

## 2019-07-12 NOTE — Patient Instructions (Addendum)
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  You will be treated in the clinic on Days 1-3 every 3 weeks with a combination of chemotherapy and immunotherapy drugs.  Those drugs are atezolizumab (Tecentriq), carboplatin, and etoposide (VP-16).  You will receive all three drugs on Day 1, and only the VP-16 on days 2-3.  We will do this particular treatment x 4 cycles, then you will come to the clinic every 3 weeks to receive Tecentriq (immunotherapy) only as maintenance therapy.  The intent of treatment is to control your cancer, prevent it from spreading further, and to alleviate any symptoms you may be having related to your disease. You will see the doctor regularly throughout treatment.  We will obtain blood work from you prior to every treatment and monitor your results to make sure it is safe to give your treatment. The doctor monitors your response to treatment by the way you are feeling, your blood work, and by obtaining scans periodically.  There will be wait times while you are here for treatment.  It will take about 30 minutes to 1 hour for your lab work to result.  Then there will be wait times while pharmacy mixes your medications.    Atezolizumab Douglas Baxter)  About This Drug Douglas Baxter is used to treat cancer. It is given by the vein (IV).  Possible Side Effects . Nausea . Tiredness and weakness . Decreased appetite (decreased hunger) . Cough . Trouble breathing  Note: Each of the side effects above was reported in 20% or greater of patients treated with atezolizumab. Your side effects may be different if you are taking atezolizumab in combination with another agent. Not all possible side effects are included above.  Warnings and Precautions . This drug works with your immune system and can cause inflammation (swelling) in any of your organs and tissues and can change how they work.  This may put you at risk for developing serious medical problems, which can be life-threatening.  . Inflammation of the lungs which can be life-threatening. You may have a dry cough or trouble breathing.  . Severe changes in your liver function which can cause liver failure and be life-threatening.  . Colitis which is swelling in the colon. The symptoms are diarrhea, stomach cramping, and sometimes blood in the bowel movements.  . Changes in your central nervous system can happen. The central nervous system is made up of your brain and spinal cord. You could feel extreme tiredness, agitation, confusion, hallucinations (see or hear things that are not there), trouble understanding or speaking, loss of control of your bowels or bladder, eyesight changes, numbness or lack of strength to your arms, legs, face, or body, and coma. If you start to have any of these symptoms let your doctor know right away.  . This drug may affect your hormone glands (thyroid, adrenals, pituitary and pancreas).  . Blood sugar levels may change, and you may develop diabetes. If you already have diabetes, changes may need to be made to your diabetes medication.  . Severe infections, including viral, bacterial and fungal, which can be life-threatening  . While you are getting this drug in your vein (IV), you may have a reaction to the drug. Sometimes you may be given medication to stop or lessen these side effects. Your nurse will check you closely for these signs: fever or shaking chills, flushing, facial swelling, feeling dizzy, headache, trouble breathing,  rash, itching, chest tightness, or chest pain. These reactions may happen after your infusion. If this happens, call 911 for emergency care.  Note: Some of the side effects above are very rare. If you have concerns and/or questions, please discuss them with your medical team.  Important Information . This drug may be present in the saliva, tears, sweat, urine, stool,  vomit, semen, and vaginal secretions. Talk to your doctor and/or your nurse about the necessary precautions to take during this time.   Treating Side Effects  . Manage tiredness by pacing your activities for the day.  . Be sure to include periods of rest between energy-draining activities.  . To decrease the risk of infection, wash your hands regularly.  . Avoid close contact with people who have a cold, the flu, or other infections.  . Take your temperature as your doctor or nurse tells you, and whenever you feel like you may have a fever.  . Drink plenty of fluids (a minimum of eight glasses per day is recommended).  . Ask your doctor or nurse about medicine that is available to help stop or lessen loose bowel movements.  . To help with nausea, eat small, frequent meals instead of three large meals a day. Choose foods and drinks that are at room temperature. Ask your nurse or doctor about other helpful tips and medicine that is available to help stop or lessen these symptoms. . To help with decreased appetite, eat small, frequent meals. Eat foods high in calories and protein, such as meat, poultry, fish, dry beans, tofu, eggs, nuts, milk, yogurt, cheese, ice cream, pudding, and nutritional supplements.  . Consider using sauces and spices to increase taste. Daily exercise, with your doctor's approval, may increase your appetite.  . If you have diabetes, keep good control of your blood sugar level. Tell your nurse or your doctor if your glucose levels are higher or lower than normal.  . Keeping your pain under control is important to your well-being. Please tell your doctor or nurse if you are experiencing pain.  . If you get a rash do not put anything on it unless your doctor or nurse says you may. Keep the area around the rash clean and dry. Ask your doctor for medicine if your rash bothers you.  . If you have numbness and tingling in your hands and feet, be careful when cooking,  walking, and handling sharp objects and hot liquids.  . Infusion reactions may happen after your infusion. If this happens, call 911 for emergency care.  Food and Drug Interactions . There are no known interactions of atezolizumab with food.  . This drug may interact with other medicines. Tell your doctor and pharmacist about all the prescription and over-the-counter medicines and dietary supplements (vitamins, minerals, herbs and others) that you are taking at this time. Also, check with your doctor or pharmacist before starting any new prescription or over-the-counter medicines, or dietary supplements to make sure that there are no interactions.  When to Call the Doctor  Call your doctor or nurse if you have any of these symptoms and/or any new or unusual symptoms:  . Fever of 100.4 F (38 C) or higher  . Chills  . Tiredness that interferes with your daily activities  . Feeling dizzy or lightheaded  . Pain in your chest  . Dry cough  . Coughing up yellow, green, or bloody mucus  . Wheezing or trouble breathing  . Feeling that your heart is beating in  a fast or not normal way (palpitations)  . Confusion and/or agitation  . Hallucinations  . Trouble understanding or speaking  . Numbness or lack of strength to your arms, legs, face, or body  . Blurred vision or other changes in eyesight  . Diarrhea, 4 times in one day or diarrhea with lack of strength or a feeling of being dizzy  . Pain in your abdomen that does not go away  . Blood in your stool  . Nausea that stops you from eating or drinking and/or is not relieved by prescribed medicines  . Throwing up  . Lasting loss of appetite or rapid weight loss of five Baxter in a week  . Abnormal blood sugar  . Unusual thirst, passing urine often, headache, sweating, shakiness, irritability  . Pain that does not go away, or is not relieved by prescribed medicines  . Numbness, tingling, or pain your hands and  feet  . Extreme weakness that interferes with normal activities  . A new rash or a rash that is not relieved by prescribed medicines  . Signs of infusion reaction: fever or shaking chills, flushing, facial swelling, feeling dizzy, headache, trouble breathing, rash, itching, chest tightness, or chest pain. If this happens, call 911 for emergency care.  . Signs of possible liver problems: dark urine, pale bowel movements, bad stomach pain, feeling very tired and weak, unusual itching, or yellowing of the eyes or skin  . If you think you may be pregnant  Reproduction Warnings  . Pregnancy warning: This drug can have harmful effects on the unborn baby. Women of childbearing potential should use effective methods of birth control during your cancer treatment and for at least 5 months after treatment. Let your doctor know right away if you think you may be pregnant.  . Breastfeeding warning: It is not known if this drug passes into breast milk. For this reason, Women should not breastfeed during treatment and for at least 5 months after treatment because this drug could enter the breast milk and cause harm to a breastfeeding baby.  . Fertility warning: In women, this drug may affect your ability to have children in the future. Talk with your doctor or nurse if you plan to have children. Ask for information on egg banking.  Carboplatin (Paraplatin, CBDCA)  About This Drug  Carboplatin is used to treat cancer. It is given in the vein (IV).  This drug will take 30 minutes to infuse.   Possible Side Effects  . Bone marrow suppression. This is a decrease in the number of white blood cells, red blood cells, and platelets. This may raise your risk of infection, make you tired and weak (fatigue), and raise your risk of bleeding.  . Nausea and vomiting (throwing up)  . Weakness  . Changes in your liver function  . Changes in your kidney function  . Electrolyte changes  . Pain  Note: Each  of the side effects above was reported in 20% or greater of patients treated with carboplatin. Not all possible side effects are included above.  Warnings and Precautions  . Severe bone marrow suppression  . Allergic reactions, including anaphylaxis are rare but may happen in some patients. Signs of allergic reaction to this drug may be swelling of the face, feeling like your tongue or throat are swelling, trouble breathing, rash, itching, fever, chills, feeling dizzy, and/or feeling that your heart is beating in a fast or not normal way. If this happens, do not take another  dose of this drug. You should get urgent medical treatment.  . Severe nausea and vomiting  . Effects on the nerves are called peripheral neuropathy. This risk is increased if you are over the age of 2 or if you have received other medicine with risk of peripheral neuropathy. You may feel numbness, tingling, or pain in your hands and feet. It may be hard for you to button your clothes, open jars, or walk as usual. The effect on the nerves may get worse with more doses of the drug. These effects get better in some people after the drug is stopped but it does not get better in all people.  Marland Kitchen Blurred vision, loss of vision or other changes in eyesight  . Decreased hearing   - Skin and tissue irritation including redness, pain, warmth, or swelling at the IV site if the drug leaks out of the vein and into nearby tissue.  . Severe changes in your kidney function, which can cause kidney failure  . Severe changes in your liver function, which can cause liver failure  Note: Some of the side effects above are very rare. If you have concerns and/or questions, please discuss them with your medical team.  Important Information  . This drug may be present in the saliva, tears, sweat, urine, stool, vomit, semen, and vaginal secretions. Talk to your doctor and/or your nurse about the necessary precautions to take during this  time.  Treating Side Effects  . Manage tiredness by pacing your activities for the day.  . Be sure to include periods of rest between energy-draining activities.  . To decrease the risk of infection, wash your hands regularly.  . Avoid close contact with people who have a cold, the flu, or other infections.  . Take your temperature as your doctor or nurse tells you, and whenever you feel like you may have a fever.  . To help decrease the risk of bleeding, use a soft toothbrush. Check with your nurse before using dental floss.  . Be very careful when using knives or tools.  . Use an electric shaver instead of a razor.  . Drink plenty of fluids (a minimum of eight glasses per day is recommended).  . If you throw up or have loose bowel movements, you should drink more fluids so that you do not become dehydrated (lack of water in the body from losing too much fluid).  . To help with nausea and vomiting, eat small, frequent meals instead of three large meals a day. Choose foods and drinks that are at room temperature. Ask your nurse or doctor about other helpful tips and medicine that is available to help stop or lessen these symptoms.  . If you have numbness and tingling in your hands and feet, be careful when cooking, walking, and handling sharp objects and hot liquids.  Marland Kitchen Keeping your pain under control is important to your well-being. Please tell your doctor or nurse if you are experiencing pain.  Food and Drug Interactions . There are no known interactions of carboplatin with food.  . This drug may interact with other medicines. Tell your doctor and pharmacist about all the prescription and over-the-counter medicines and dietary supplements (vitamins, minerals, herbs and others) that you are taking at this time. Also, check with your doctor or pharmacist before starting any new prescription or over-the-counter medicines, or dietary supplements to make sure that there are no  interactions.  When to Call the Doctor  Call your doctor or  nurse if you have any of these symptoms and/or any new or unusual symptoms:  . Fever of 100.4 F (38 C) or higher  . Chills  . Tiredness that interferes with your daily activities  . Feeling dizzy or lightheaded  . Easy bleeding or bruising  . Nausea that stops you from eating or drinking and/or is not relieved by prescribed medicines  . Throwing up  . Blurred vision or other changes in eyesight  . Decrease in hearing or ringing in the ear  . Signs of allergic reaction: swelling of the face, feeling like your tongue or throat are swelling, trouble breathing, rash, itching, fever, chills, feeling dizzy, and/or feeling that your heart is beating in a fast or not normal way. If this happens, call 911 for emergency care.  . Signs of possible liver problems: dark urine, pale bowel movements, bad stomach pain, feeling very tired and weak, unusual itching, or yellowing of the eyes or skin  . Decreased urine, or very dark urine  . Numbness, tingling, or pain in your hands and feet  . Pain that does not go away or is not relieved by prescribed medicine  . While you are getting this drug, please tell your nurse right away if you have any pain, redness, or swelling at the site of the IV infusion, or if you have any new onset of symptoms, or if you just feel "different" from before when the infusion was started.   Reproduction Warnings  . Pregnancy warning: This drug may have harmful effects on the unborn baby. Women of child bearing potential should use effective methods of birth control during your cancer treatment. Let your doctor know right away if you think you may be pregnant.  . Breastfeeding warning: It is not known if this drug passes into breast milk. For this reason, women should not breastfeed during treatment because this drug could enter the breast milk and cause harm to a breastfeeding baby.  . Fertility  warning: Human fertility studies have not been done with this drug. Talk with your doctor or nurse if you plan to have children. Ask for information on sperm or egg banking.  Etoposide  About This Drug  Etoposide is used to treat cancer. It is given in the vein (IV).  This drug will take 1 hour to infuse.    Possible Side Effects  . Bone marrow suppression. This is a decrease in the number of white blood cells, red blood cells, and platelets. This may raise your risk of infection, make you tired and weak (fatigue), and raise your risk of bleeding.  . Nausea and vomiting (throwing up)  . Hair loss. Hair loss is often temporary, although with certain medicine, hair loss can sometimes be permanent. Hair loss may happen suddenly or gradually. If you lose hair, you may lose it from your head, face, armpits, pubic area, chest, and/or legs. You may also notice your hair getting thin.  Note: Each of the side effects above was reported in 20% or greater of patients treated with etoposide. Not all possible side effects are included above.  Warnings and Precautions  . Severe bone marrow suppression, which may be life-threatening  . This drug may raise your risk of getting a second cancer, such as leukemia  . Low blood pressure with rapid infusion of the medication  . Allergic reactions, including anaphylaxis are rare but may happen in some patients. Signs of allergic reaction to this drug may be swelling of the face,  feeling like your tongue or throat are swelling, trouble breathing, rash, itching, fever, chills, feeling dizzy, and/or feeling that your heart is beating in a fast or not normal way. If this happens, do not take another dose of this drug. You should get urgent medical treatment.  . These side effects may be more severe if you are receiving high doses of this medication included in pre-transplant chemotherapy.  Treating Side Effects  . Manage tiredness by pacing your activities for  the day.  . Be sure to include periods of rest between energy-draining activities.  . To decrease the risk of infection, wash your hands regularly.  . Avoid close contact with people who have a cold, the flu, or other infections.  . Take your temperature as your doctor or nurse tells you, and whenever you feel like you may have a fever.  . To help decrease bleeding, use a soft toothbrush. Check with your nurse before using dental floss.  . Be very careful when using knives or tools.  . Use an electric shaver instead of a razor.  . Drink plenty of fluids (a minimum of eight glasses per day is recommended).  . If you throw up or have loose bowel movements, you should drink more fluids so that you do not become dehydrated (lack of water in the body from losing too much fluid).  . To help with nausea and vomiting, eat small, frequent meals instead of three large meals a day. Choose foods and drinks that are at room temperature. Ask your nurse or doctor about other helpful tips and medicine that is available to help or stop lessen these symptoms.  . To help with hair loss, wash with a mild shampoo and avoid washing your hair every day.  . Avoid rubbing your scalp, pat your hair or scalp dry.  . Avoid coloring your hair.  . Limit your use of hair spray, electric curlers, blow dryers, and curling irons.  . If you are interested in getting a wig, talk to your nurse. You can also call the [facility] at [phone]-[phone] to find out information about the "Look Good, Feel Better" program close to where you live. It is a free program where women getting chemotherapy can learn about wigs, turbans and scarves as well as makeup techniques and skin and nail care.  Food and Drug Interactions  . There are no known interactions of etoposide with food.  . This drug may interact with other medicines. Tell your doctor and pharmacist about all the medicines and dietary supplements (vitamins,  minerals, herbs and others) that you are taking at this time. The safety and use of dietary supplements and alternative diets are often not known. Using these might affect your cancer or interfere with your treatment. Until more is known, you should not use dietary supplements or alternative diets without your cancer doctor's help.  . There are known interactions of etoposide with blood thinning medicine such as warfarin. Ask your doctor what precautions you should take.  When to Call the Doctor  Call your doctor or nurse if you have any of these symptoms and/or any new or unusual symptoms:  . Fever of 100.4 F (38 C) or higher  . Chills  . Tiredness that interferes with your daily activities  . Feeling dizzy or lightheaded  . Feeling that your heart is beating in a fast or not normal way (palpitations)  . Easy bleeding or bruising  . Nausea that stops you from eating  or drinking and/or is not relieved by prescribed medicines  . Throwing up  . Signs of allergic reaction: swelling of the face, feeling like your tongue or throat are swelling, trouble breathing, rash, itching, fever, chills, feeling dizzy, and/or feeling that your heart is beating in a fast or not normal way  . If you think you may be pregnant or may have impregnated your partner  Reproduction Warnings  . Pregnancy warning: This drug can have harmful effects on the unborn baby. Women of childbearing potential should use effective methods of birth control during your cancer treatment and for at least 6 months after treatment. Men with male partners of childbearing potential should use effective methods of birth control during your cancer treatment and for at least 4 months after your cancer treatment. Let your doctor know right away if you think you may be pregnant or may have impregnated your partner.  . Breastfeeding warning: It is not known if this drug passes into breast milk. For this reason, women should talk to  their doctor about the risks and benefits of breastfeeding during treatment with this drug because this drug may enter the breast milk and cause harm to a breastfeeding baby.  . Fertility warning: In men and women both, this drug may affect your ability to have children in the future. Talk with your doctor or nurse if you plan to have children. Ask for information on sperm or egg banking.   SELF CARE ACTIVITIES WHILE RECEIVING CHEMOTHERAPY:  Hydration Increase your fluid intake 48 hours prior to treatment and drink at least 8 to 12 cups (64 ounces) of water/decaffeinated beverages per day after treatment. You can still have your cup of coffee or soda but these beverages do not count as part of your 8 to 12 cups that you need to drink daily. No alcohol intake.  Medications Continue taking your normal prescription medication as prescribed.  If you start any new herbal or new supplements please let us know first to make sure it is safe.  Mouth Care Have teeth cleaned professionally before starting treatment. Keep dentures and partial plates clean. Use soft toothbrush and do not use mouthwashes that contain alcohol. Biotene is a good mouthwash that is available at most pharmacies or may be ordered by calling [phone]. Use warm salt water gargles (1 teaspoon salt per 1 quart warm water) before and after meals and at bedtime. If you need dental work, please let the doctor know before you go for your appointment so that we can coordinate the best possible time for you in regards to your chemo regimen. You need to also let your dentist know that you are actively taking chemo. We may need to do labs prior to your dental appointment.  Skin Care Always use sunscreen that has not expired and with SPF (Sun Protection Factor) of 50 or higher. Wear hats to protect your head from the sun. Remember to use sunscreen on your hands, ears, face, & feet.  Use good moisturizing lotions such as udder cream, eucerin,  or even Vaseline. Some chemotherapies can cause dry skin, color changes in your skin and nails.    . Avoid long, hot showers or baths. . Use gentle, fragrance-free soaps and laundry detergent. . Use moisturizers, preferably creams or ointments rather than lotions because the thicker consistency is better at preventing skin dehydration. Apply the cream or ointment within 15 minutes of showering. Reapply moisturizer at night, and moisturize your hands every time after you wash  them.  Hair Loss (if your doctor says your hair will fall out)  . If your doctor says that your hair is likely to fall out, decide before you begin chemo whether you want to wear a wig. You may want to shop before treatment to match your hair color. . Hats, turbans, and scarves can also camouflage hair loss, although some people prefer to leave their heads uncovered. If you go bare-headed outdoors, be sure to use sunscreen on your scalp. . Cut your hair short. It eases the inconvenience of shedding lots of hair, but it also can reduce the emotional impact of watching your hair fall out. . Don't perm or color your hair during chemotherapy. Those chemical treatments are already damaging to hair and can enhance hair loss. Once your chemo treatments are done and your hair has grown back, it's OK to resume dyeing or perming hair.  With chemotherapy, hair loss is almost always temporary. But when it grows back, it may be a different color or texture. In older adults who still had hair color before chemotherapy, the new growth may be completely gray.  Often, new hair is very fine and soft.  Infection Prevention Please wash your hands for at least 30 seconds using warm soapy water. Handwashing is the #1 way to prevent the spread of germs. Stay away from sick people or people who are getting over a cold. If you develop respiratory systems such as green/yellow mucus production or productive cough or persistent cough let us know and we will  see if you need an antibiotic. It is a good idea to keep a pair of gloves on when going into grocery stores/[employer] to decrease your risk of coming into contact with germs on the carts, etc. Carry alcohol hand gel with you at all times and use it frequently if out in public. If your temperature reaches 100.4 or higher please call the clinic and let us know.  If it is after hours or on the weekend please go to the ER if your temperature is over 100.4.  Please have your own personal thermometer at home to use.    Sex and bodily fluids If you are going to have sex, a condom must be used to protect the person that isn't taking chemotherapy. Chemo can decrease your libido (sex drive). For a few days after chemotherapy, chemotherapy can be excreted through your bodily fluids.  When using the toilet please close the lid and flush the toilet twice.  Do this for a few day after you have had chemotherapy.   Effects of chemotherapy on your sex life Some changes are simple and won't last long. They won't affect your sex life permanently.  Sometimes you may feel: . too tired . not strong enough to be very active . sick or sore  . not in the mood . anxious or low  Your anxiety might not seem related to sex. For example, you may be worried about the cancer and how your treatment is going. Or you may be worried about money, or about how you family are coping with your illness.  These things can cause stress, which can affect your interest in sex. It's important to talk to your partner about how you feel.  Remember - the changes to your sex life don't usually last long. There's usually no medical reason to stop having sex during chemo. The drugs won't have any long term physical effects on your performance or enjoyment of sex. Cancer can't  be passed on to your partner during sex  Contraception It's important to use reliable contraception during treatment. Avoid getting pregnant while you or your partner are having  chemotherapy. This is because the drugs may harm the baby. Sometimes chemotherapy drugs can leave a man or woman infertile.  This means you would not be able to have children in the future. You might want to talk to someone about permanent infertility. It can be very difficult to learn that you may no longer be able to have children. Some people find counselling helpful. There might be ways to preserve your fertility, although this is easier for men than for women. You may want to speak to a fertility expert. You can talk about sperm banking or harvesting your eggs. You can also ask about other fertility options, such as donor eggs. If you have or have had breast cancer, your doctor might advise you not to take the contraceptive pill. This is because the hormones in it might affect the cancer. It is not known for sure whether or not chemotherapy drugs can be passed on through semen or secretions from the vagina. Because of this some doctors advise people to use a barrier method if you have sex during treatment. This applies to vaginal, anal or oral sex. Generally, doctors advise a barrier method only for the time you are actually having the treatment and for about a week after your treatment. Advice like this can be worrying, but this does not mean that you have to avoid being intimate with your partner. You can still have close contact with your partner and continue to enjoy sex.  Animals If you have cats or birds we just ask that you not change the litter or change the cage.  Please have someone else do this for you while you are on chemotherapy.   Food Safety During and After Cancer Treatment Food safety is important for people both during and after cancer treatment. Cancer and cancer treatments, such as chemotherapy, radiation therapy, and stem cell/bone marrow transplantation, often weaken the immune system. This makes it harder for your body to protect itself from foodborne illness, also called food  poisoning. Foodborne illness is caused by eating food that contains harmful bacteria, parasites, or viruses.  Foods to avoid Some foods have a higher risk of becoming tainted with bacteria. These include: Marland Kitchen Unwashed fresh fruit and vegetables, especially leafy vegetables that can hide dirt and other contaminants . Raw sprouts, such as alfalfa sprouts . Raw or undercooked beef, especially ground beef, or other raw or undercooked meat and poultry . Fatty, fried, or spicy foods immediately before or after treatment.  These can sit heavy on your stomach and make you feel nauseous. . Raw or undercooked shellfish, such as oysters. . Sushi and sashimi, which often contain raw fish.  . Unpasteurized beverages, such as unpasteurized fruit juices, raw milk, raw yogurt, or cider . Undercooked eggs, such as soft boiled, over easy, and poached; raw, unpasteurized eggs; or foods made with raw egg, such as homemade raw cookie dough and homemade mayonnaise  Simple steps for food safety  Shop smart. . Do not buy food stored or displayed in an unclean area. . Do not buy bruised or damaged fruits or vegetables. . Do not buy cans that have cracks, dents, or bulges. . Pick up foods that can spoil at the end of your shopping trip and store them in a cooler on the way home.  Prepare and clean up foods  carefully. . Rinse all fresh fruits and vegetables under running water, and dry them with a clean towel or paper towel. . Clean the top of cans before opening them. . After preparing food, wash your hands for 20 seconds with hot water and soap. Pay special attention to areas between fingers and under nails. . Clean your utensils and dishes with hot water and soap. Marland Kitchen Disinfect your kitchen and cutting boards using 1 teaspoon of liquid, unscented bleach mixed into 1 quart of water.    Dispose of old food. . Eat canned and packaged food before its expiration date (the "use by" or "best before" date). . Consume  refrigerated leftovers within 3 to 4 days. After that time, throw out the food. Even if the food does not smell or look spoiled, it still may be unsafe. Some bacteria, such as Listeria, can grow even on foods stored in the refrigerator if they are kept for too long.  Take precautions when eating out. . At restaurants, avoid buffets and salad bars where food sits out for a long time and comes in contact with many people. Food can become contaminated when someone with a virus, often a norovirus, or another "bug" handles it. . Put any leftover food in a "to-go" container yourself, rather than having the server do it. And, refrigerate leftovers as soon as you get home. . Choose restaurants that are clean and that are willing to prepare your food as you order it cooked.   AT HOME MEDICATIONS:                                                                                                                                                                Compazine/Prochlorperazine 10mg  tablet. Take 1 tablet every 6 hours as needed for nausea/vomiting. (This can make you sleepy)   EMLA cream. Apply a quarter size amount to port site 1 hour prior to chemo. Do not rub in. Cover with plastic wrap.    Diarrhea Sheet   If you are having loose stools/diarrhea, please purchase Imodium and begin taking as outlined:  At the first sign of poorly formed or loose stools you should begin taking Imodium (loperamide) 2 mg capsules.  Take two tablets (4mg ) followed by one tablet (2mg ) every 2 hours - DO NOT EXCEED 8 tablets in 24 hours.  If it is bedtime and you are having loose stools, take 2 tablets at bedtime, then 2 tablets every 4 hours until morning.   Always call the [facility] if you are having loose stools/diarrhea that you can't get under control.  Loose stools/diarrhea leads to dehydration (loss of water) in your body.  We have other options of trying to get the loose stools/diarrhea to stop but you must let  us know!  Constipation Sheet  Colace - 100 mg capsules - take 2 capsules daily.  If this doesn't help then you can increase to 2 capsules twice daily.  Please call if the above does not work for you. Do not go more than 2 days without a bowel movement.  It is very important that you do not become constipated.  It will make you feel sick to your stomach (nausea) and can cause abdominal pain and vomiting.  Nausea Sheet   Compazine/Prochlorperazine 10mg  tablet. Take 1 tablet every 6 hours as needed for nausea/vomiting (This can make you drowsy).  If you are having persistent nausea (nausea that does not stop) please call the [facility] and let us know the amount of nausea that you are experiencing.  If you begin to vomit, you need to call the [facility] and if it is the weekend and you have vomited more than one time and can't get it to stop-go to the Emergency Room.  Persistent nausea/vomiting can lead to dehydration (loss of fluid in your body) and will make you feel very weak and unwell. Ice chips, sips of clear liquids, foods that are at room temperature, crackers, and toast tend to be better tolerated.   SYMPTOMS TO REPORT AS SOON AS POSSIBLE AFTER TREATMENT:  FEVER GREATER THAN 100.4 F  CHILLS WITH OR WITHOUT FEVER  NAUSEA AND VOMITING THAT IS NOT CONTROLLED WITH YOUR NAUSEA MEDICATION  UNUSUAL SHORTNESS OF BREATH  UNUSUAL BRUISING OR BLEEDING  TENDERNESS IN MOUTH AND THROAT WITH OR WITHOUT   PRESENCE OF ULCERS  URINARY PROBLEMS  BOWEL PROBLEMS  UNUSUAL RASH      Wear comfortable clothing and clothing appropriate for easy access to any Portacath or PICC line. Let us know if there is anything that we can do to make your therapy better!    What to do if you need assistance after hours or on the weekends: CALL [phone].  HOLD on the line, do not hang up.  You will hear multiple messages but at the end you will be connected with a nurse triage line.  They will  contact the doctor if necessary.  Most of the time they will be able to assist you.  Do not call the hospital operator.      I have been informed and understand all of the instructions given to me and have received a copy. I have been instructed to call the clinic [phone] or my family physician as soon as possible for continued medical care, if indicated. I do not have any more questions at this time but understand that I may call the [facility] or the Patient Navigator at [phone] during office hours should I have questions or need assistance in obtaining follow-up care.      [facility] Discharge Instructions for Patients Receiving Chemotherapy  Today you received the following chemotherapy agents   To help prevent nausea and vomiting after your treatment, we encourage you to take your nausea medication    If you develop nausea and vomiting that is not controlled by your nausea medication, call the clinic.   BELOW ARE SYMPTOMS THAT SHOULD BE REPORTED IMMEDIATELY:  *FEVER GREATER THAN 100.5 F  *CHILLS WITH OR WITHOUT FEVER  NAUSEA AND VOMITING THAT IS NOT CONTROLLED WITH YOUR NAUSEA MEDICATION  *UNUSUAL SHORTNESS OF BREATH  *UNUSUAL BRUISING OR BLEEDING  TENDERNESS IN MOUTH AND THROAT WITH OR WITHOUT PRESENCE OF ULCERS  *URINARY PROBLEMS  *BOWEL PROBLEMS  UNUSUAL  RASH Items with * indicate a potential emergency and should be followed up as soon as possible.  Feel free to call the clinic should you have any questions or concerns. The clinic phone number is [phone].  Please show the [facility] at check-in to the Emergency Department and triage nurse.

## 2019-07-12 NOTE — Progress Notes (Signed)
Orders for STAT CT head per Dr. Delton Coombes.

## 2019-07-12 NOTE — Telephone Encounter (Addendum)
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  S/p VATS left upper lobectomy.  Planning carboplatin, VP-16 and atezolizumab.    Spoke with patient and wife via phone.  Wife reports poor po intake. Patient able to drink 1 of boost soothe per day.  Does not like the taste of milky shakes.  Wife reports bowel movement mid day today which has helped patient feel better.  Reports has eaten oatmeal so far today and planning on eating a little chicken and dumplings in a little bit.  Ate peanut butter and banana sandwich yesterday.  Overall appetite is poor.      Medications: lactulose   Labs: reviewed   Anthropometrics:   Height: 60 inches Weight: 174 lb  191 lb 4/15 BMI: 23  9% weight loss in the last 3 weeks, significant   NUTRITION DIAGNOSIS: Unintentional weight loss related to cancer related side effects as evidenced by 9% weight loss in the last 3 weeks   INTERVENTION:  Discussed where to purchase boost soothe.  Will provide additional samples for patient to pick up on Monday at next visit Discussed strategies to increase calories and protein Encouraged fluids and continue bowel regimen to help with constipation.  Wife given contact information   MONITORING, EVALUATION, GOAL: weight trends, intake   Next Visit: June 11 PHONE f/u  Douglas Baxter, [place], [place] Registered Dietitian [phone] (pager)

## 2019-07-15 ENCOUNTER — Ambulatory Visit (HOSPITAL_COMMUNITY): Admission: RE | Admit: 2019-07-15 | Payer: Medicare HMO | Source: Ambulatory Visit

## 2019-07-15 ENCOUNTER — Encounter (HOSPITAL_COMMUNITY): Payer: Self-pay

## 2019-07-15 ENCOUNTER — Ambulatory Visit (HOSPITAL_COMMUNITY): Payer: Medicare HMO

## 2019-07-15 ENCOUNTER — Inpatient Hospital Stay (HOSPITAL_COMMUNITY): Payer: Medicare HMO

## 2019-07-15 ENCOUNTER — Other Ambulatory Visit (HOSPITAL_COMMUNITY): Payer: Self-pay | Admitting: *Deleted

## 2019-07-15 ENCOUNTER — Other Ambulatory Visit (HOSPITAL_COMMUNITY): Payer: Medicare HMO

## 2019-07-15 ENCOUNTER — Other Ambulatory Visit: Payer: Self-pay

## 2019-07-15 VITALS — BP 119/46 | HR 94 | Temp 96.9°F | Resp 18 | Wt 171.2 lb

## 2019-07-15 DIAGNOSIS — C3490 Malignant neoplasm of unspecified part of unspecified bronchus or lung: Secondary | ICD-10-CM

## 2019-07-15 DIAGNOSIS — C7A8 Other malignant neuroendocrine tumors: Secondary | ICD-10-CM

## 2019-07-15 DIAGNOSIS — Z5112 Encounter for antineoplastic immunotherapy: Secondary | ICD-10-CM | POA: Diagnosis not present

## 2019-07-15 LAB — CBC WITH DIFFERENTIAL/PLATELET
Abs Immature Granulocytes: 0.05 10*3/uL (ref 0.00–0.07)
Basophils Absolute: 0 10*3/uL (ref 0.0–0.1)
Basophils Relative: 0 %
Eosinophils Absolute: 0 10*3/uL (ref 0.0–0.5)
Eosinophils Relative: 0 %
HCT: 47.2 % (ref 39.0–52.0)
Hemoglobin: 15.3 g/dL (ref 13.0–17.0)
Immature Granulocytes: 1 %
Lymphocytes Relative: 10 %
Lymphs Abs: 1 10*3/uL (ref 0.7–4.0)
MCH: 29.8 pg (ref 26.0–34.0)
MCHC: 32.4 g/dL (ref 30.0–36.0)
MCV: 91.8 fL (ref 80.0–100.0)
Monocytes Absolute: 1 10*3/uL (ref 0.1–1.0)
Monocytes Relative: 9 %
Neutro Abs: 8.5 10*3/uL — ABNORMAL HIGH (ref 1.7–7.7)
Neutrophils Relative %: 80 %
Platelets: 298 10*3/uL (ref 150–400)
RBC: 5.14 MIL/uL (ref 4.22–5.81)
RDW: 14.7 % (ref 11.5–15.5)
WBC: 10.6 10*3/uL — ABNORMAL HIGH (ref 4.0–10.5)
nRBC: 0 % (ref 0.0–0.2)

## 2019-07-15 LAB — COMPREHENSIVE METABOLIC PANEL
ALT: 68 U/L — ABNORMAL HIGH (ref 0–44)
AST: 97 U/L — ABNORMAL HIGH (ref 15–41)
Albumin: 3.8 g/dL (ref 3.5–5.0)
Alkaline Phosphatase: 374 U/L — ABNORMAL HIGH (ref 38–126)
Anion gap: 13 (ref 5–15)
BUN: 37 mg/dL — ABNORMAL HIGH (ref 8–23)
CO2: 23 mmol/L (ref 22–32)
Calcium: 12.1 mg/dL — ABNORMAL HIGH (ref 8.9–10.3)
Chloride: 99 mmol/L (ref 98–111)
Creatinine, Ser: 1.57 mg/dL — ABNORMAL HIGH (ref 0.61–1.24)
GFR calc Af Amer: 49 mL/min — ABNORMAL LOW (ref >60)
GFR calc non Af Amer: 42 mL/min — ABNORMAL LOW (ref >60)
Glucose, Bld: 119 mg/dL — ABNORMAL HIGH (ref 70–99)
Potassium: 4.5 mmol/L (ref 3.5–5.1)
Sodium: 135 mmol/L (ref 135–145)
Total Bilirubin: 1.9 mg/dL — ABNORMAL HIGH (ref 0.3–1.2)
Total Protein: 7.2 g/dL (ref 6.5–8.1)

## 2019-07-15 LAB — TSH: TSH: 6.508 u[IU]/mL — ABNORMAL HIGH (ref 0.350–4.500)

## 2019-07-15 LAB — URIC ACID: Uric Acid, Serum: 13.6 mg/dL — ABNORMAL HIGH (ref 3.7–8.6)

## 2019-07-15 LAB — PSA: Prostatic Specific Antigen: <0.01 ng/mL (ref 0.00–4.00)

## 2019-07-15 MED ORDER — SODIUM CHLORIDE 0.9 % IV SOLN: Freq: Once | INTRAVENOUS | Status: AC

## 2019-07-15 MED ORDER — PALONOSETRON HCL INJECTION 0.25 MG/5ML
0.2500 mg | Freq: Once | INTRAVENOUS | Status: AC
  Administered 2019-07-15: 0.25 mg via INTRAVENOUS
  Filled 2019-07-15: qty 5

## 2019-07-15 MED ORDER — SODIUM CHLORIDE 0.9 % IV SOLN
349.5000 mg | Freq: Once | INTRAVENOUS | Status: AC
  Administered 2019-07-15: 350 mg via INTRAVENOUS
  Filled 2019-07-15: qty 35

## 2019-07-15 MED ORDER — OXYCODONE HCL 10 MG PO TABS
10.0000 mg | ORAL_TABLET | ORAL | 0 refills | Status: AC | PRN
Start: 2019-07-15 — End: ?

## 2019-07-15 MED ORDER — SODIUM CHLORIDE 0.9 % IV SOLN
150.0000 mg | Freq: Once | INTRAVENOUS | Status: AC
  Administered 2019-07-15: 150 mg via INTRAVENOUS
  Filled 2019-07-15: qty 150

## 2019-07-15 MED ORDER — ALLOPURINOL 300 MG PO TABS
300.0000 mg | ORAL_TABLET | Freq: Every day | ORAL | 0 refills | Status: AC
Start: 2019-07-15 — End: ?

## 2019-07-15 MED ORDER — MISC. DEVICES MISC: 1 refills | Status: AC

## 2019-07-15 MED ORDER — SODIUM CHLORIDE 0.9 % IV SOLN
10.0000 mg | Freq: Once | INTRAVENOUS | Status: AC
  Administered 2019-07-15: 10 mg via INTRAVENOUS
  Filled 2019-07-15: qty 10

## 2019-07-15 MED ORDER — HYDROMORPHONE HCL 1 MG/ML IJ SOLN
INTRAMUSCULAR | Status: AC
  Filled 2019-07-15: qty 2

## 2019-07-15 MED ORDER — HYDROMORPHONE HCL 1 MG/ML IJ SOLN
2.0000 mg | Freq: Once | INTRAMUSCULAR | Status: AC
  Administered 2019-07-15: 2 mg via INTRAVENOUS

## 2019-07-15 MED ORDER — SODIUM CHLORIDE 0.9 % IV SOLN
50.0000 mg/m2 | Freq: Once | INTRAVENOUS | Status: AC
  Administered 2019-07-15: 100 mg via INTRAVENOUS
  Filled 2019-07-15: qty 5

## 2019-07-15 MED ORDER — MISC. DEVICES MISC: 1 refills | Status: DC

## 2019-07-15 MED ORDER — SODIUM CHLORIDE 0.9 % IV SOLN
1200.0000 mg | Freq: Once | INTRAVENOUS | Status: AC
  Administered 2019-07-15: 1200 mg via INTRAVENOUS
  Filled 2019-07-15: qty 20

## 2019-07-15 NOTE — Progress Notes (Signed)
.   Pharmacist Chemotherapy Monitoring - Initial Assessment    Anticipated start date: 07/15/19   Regimen:  . Are orders appropriate based on the patient's diagnosis, regimen, and cycle? Yes . Does the plan date match the patient's scheduled date? Yes . Is the sequencing of drugs appropriate? Yes . Are the premedications appropriate for the patient's regimen? Yes . Prior Authorization for treatment is: Approved o If applicable, is the correct biosimilar selected based on the patient's insurance? not applicable  Organ Function and Labs: Marland Kitchen Are dose adjustments needed based on the patient's renal function, hepatic function, or hematologic function? Yes . Are appropriate labs ordered prior to the start of patient's treatment? Yes . Other organ system assessment, if indicated: N/A . The following baseline labs, if indicated, have been ordered: atezolizumab: baseline TSH +/- T4  Dose Assessment: . Are the drug doses appropriate? Yes . Are the following correct: o Drug concentrations Yes o IV fluid compatible with drug Yes o Administration routes Yes o Timing of therapy Yes . If applicable, does the patient have documented access for treatment and/or plans for port-a-cath placement? not applicable . If applicable, have lifetime cumulative doses been properly documented and assessed? not applicable Lifetime Dose Tracking  . Carboplatin: 1,880 mg = 0.01 % of the maximum lifetime dose of 999,999,999 mg  o   Toxicity Monitoring/Prevention: . The patient has the following take home antiemetics prescribed: Prochlorperazine . The patient has the following take home medications prescribed: N/A . Medication allergies and previous infusion related reactions, if applicable, have been reviewed and addressed. Yes . The patient's current medication list has been assessed for drug-drug interactions with their chemotherapy regimen.No significant drug-drug interactions were identified on review.  Order  Review: . Are the treatment plan orders signed? No . Is the patient scheduled to see a provider prior to their treatment? No  I verify that I have reviewed each item in the above checklist and answered each question accordingly.  Douglas Baxter 07/15/2019 10:07 AM

## 2019-07-15 NOTE — Progress Notes (Signed)
Patient and his wife were given chemotherapy education packet today.  He has had treatment in the past and is familiar with the flow of the clinic.  He was reeducated on the contact information for the clinic and how to reach [place] after hours and on weekends.  His wife confirms that she has picked up his nausea medication and he understands how to use it.  I have reeducated on his pain medication. His wife removed the pain patch today but didn't place another one.  I advised how it takes 24 hours for the medication to get into his system with the patch and he needs to have it on continuously along with taking his PRN medication. She verbalizes understanding and patient does as well.    His wife verbalizes that he is weak and seems to be weaker than last week.  She has trouble getting him up at home.  A prescription for a walker has been sent to pharmacy for them.

## 2019-07-15 NOTE — Progress Notes (Signed)
Patient presents today for treatment. Vitals signs are stable. Labs pending. Patient has no complaints of any changes since his last visit. Patient has back pain he rates a 6/10 today.   Creatinine 1.57. AST 97. Total Bilirubin 1.9.    Reported patient's complaints of back pain today and requesting something for break through pain. Per the patient's wife, she did not place a pain patch on her husband today. Notified Dr. Delton Coombes pertaining to pain medication request.   Labs reviewed by Dr. Delton Coombes. Message received to proceed with treatment. Orders received. Infuse 1 Liter of Normal Saline with chemo treatment today. Patient to received 1 Liter of Normal Saline with tomorrow and Wednesday treatment. Give 2 mg IV Dilaudid x 1 dose today for pain. Etoposide dose reduced today due to elevated liver enzymes per Dr. Delton Coombes. Message received to infuse the fluids throughout chemotherapy today.   13:28pm RN enters room and patient asleep snoring. Wife sitting beside husband. IV with Tecentriq infusing in the floor. Pump stopped. Chemo spill kit brought to room. Clean up performed per protocol.  [facility] notified. Henreitta Leber [facility] notified.    Treatment given today per MD orders. Tolerated infusion without adverse affects. Vital signs stable. No complaints at this time. Discharged from clinic via wheel chair.  F/U with [facility] as scheduled.

## 2019-07-15 NOTE — Progress Notes (Signed)
His uric acid was elevated.  I have sent a prescription for allopurinol.  We will give him hydration.  He has generalized weakness from his malignancy.  He will benefit from walker to prevent falls.

## 2019-07-15 NOTE — Progress Notes (Signed)
07/15/19  OK to treat with labs, dose of etoposide reduced by MD.  Dr Rhys Martini, PharmD

## 2019-07-16 ENCOUNTER — Inpatient Hospital Stay (HOSPITAL_COMMUNITY): Payer: Medicare HMO | Admitting: General Practice

## 2019-07-16 ENCOUNTER — Encounter (HOSPITAL_COMMUNITY): Payer: Self-pay | Admitting: General Practice

## 2019-07-16 ENCOUNTER — Ambulatory Visit (HOSPITAL_COMMUNITY): Payer: Medicare HMO | Admitting: Hematology

## 2019-07-16 ENCOUNTER — Ambulatory Visit (HOSPITAL_COMMUNITY)
Admission: RE | Admit: 2019-07-16 | Discharge: 2019-07-16 | Disposition: A | Discharge status: Home / Self Care | Payer: Medicare HMO | Source: Ambulatory Visit | Attending: Hematology | Admitting: Hematology

## 2019-07-16 ENCOUNTER — Inpatient Hospital Stay (HOSPITAL_COMMUNITY): Payer: Medicare HMO

## 2019-07-16 VITALS — BP 103/56 | HR 52 | Temp 97.1°F | Resp 18

## 2019-07-16 DIAGNOSIS — G9389 Other specified disorders of brain: Secondary | ICD-10-CM | POA: Insufficient documentation

## 2019-07-16 DIAGNOSIS — C3490 Malignant neoplasm of unspecified part of unspecified bronchus or lung: Secondary | ICD-10-CM | POA: Diagnosis present

## 2019-07-16 DIAGNOSIS — M899 Disorder of bone, unspecified: Secondary | ICD-10-CM | POA: Insufficient documentation

## 2019-07-16 DIAGNOSIS — Z5112 Encounter for antineoplastic immunotherapy: Secondary | ICD-10-CM | POA: Diagnosis not present

## 2019-07-16 LAB — T4: T4, Total: 10.1 ug/dL (ref 4.5–12.0)

## 2019-07-16 MED ORDER — SODIUM CHLORIDE 0.9 % IV SOLN: Freq: Once | INTRAVENOUS | Status: AC

## 2019-07-16 MED ORDER — SODIUM CHLORIDE 0.9 % IV SOLN
50.0000 mg/m2 | Freq: Once | INTRAVENOUS | Status: AC
  Administered 2019-07-16: 16:00:00 100 mg via INTRAVENOUS
  Filled 2019-07-16: qty 5

## 2019-07-16 MED ORDER — SODIUM CHLORIDE 0.9 % IV SOLN
6.0000 mg | Freq: Once | INTRAVENOUS | Status: AC
  Administered 2019-07-16: 6 mg via INTRAVENOUS
  Filled 2019-07-16: qty 4

## 2019-07-16 MED ORDER — IOHEXOL 300 MG/ML  SOLN
75.0000 mL | Freq: Once | INTRAMUSCULAR | Status: AC | PRN
  Administered 2019-07-16: 60 mL via INTRAVENOUS

## 2019-07-16 MED ORDER — SODIUM CHLORIDE 0.9 % IV SOLN
10.0000 mg | Freq: Once | INTRAVENOUS | Status: AC
  Administered 2019-07-16: 10 mg via INTRAVENOUS
  Filled 2019-07-16: qty 10

## 2019-07-16 NOTE — Progress Notes (Signed)
Patient presents today for treatment. Peripheral. Vital signs are stable. Patient has no complaints of any pain at this time. Patient has no complaints of any changes since his last visit. MAR reviewed.   Verbal order received from [facility] RN/ Dr. Delton Coombes. Infuse 6mg  of Elitek today. Patient to received 1046ml of NS over an hour today and tomorrow. CT of the Brain today after treatment. Anderson Malta in [place] aware.    Saline lock remains in the left wrist, 22 gauge intact. No abnormal swelling or bleeding noted. Secured with tegaderm and tape. Flushes with no resistance. Large amount of blood return noted. Saline lock left in per request form CT.   Treatment given today per MD orders. Tolerated infusion without adverse affects. Vital signs stable. No complaints at this time. Discharged from clinic via wheel chair. F/U with [facility] as scheduled.

## 2019-07-16 NOTE — Progress Notes (Signed)
[facility] Initial Psychosocial Assessment Clinical Social Work  Clinical Social Work visited patient in infusion to assess psychosocial, emotional, mental health, and spiritual needs of the patient. Patient sleepy, did not participate in visit.  Spoke briefly w wife at bedside to assess for needs/offer resources.  She reports that, at present, she is fully able to meet all needs of patient, no assistance needed.    Barriers to care/review of distress screen:  - Transportation:  Do you anticipate any problems getting to appointments?  Do you have someone who can help run errands for you if you need it? Wife drives as needed.  - Help at home:  What is your living situation (alone, family, other)?  If you are physically unable to care for yourself, who would you call on to help you?  Lives w wife, she is able to help as needed at this time.  He does need help w bathing and toileting - at some point, wife states she may need additional help in caring for husband in the home, but currently is able to maintain what is needed.   - Support system:  What does your support system look like?  Who would you call on if you needed some kind of practical help?  What if you needed someone to talk to for emotional support?  Support from family and friends.   - Finances:  Are you concerned about finances.  Considering returning to work?  If not, applying for disability?  No concerns.    CSW Summary:  Patient and family psychosocial functioning including strengths, limitations, and coping skills:  Patient diagnosed with metastatic small cell lung cancer, mets to bone and liver.  Lives w wife who is his primary caregiver.  At present, she is able to meet all his physical care needs.    Identifications of barriers to care:  None at this time.    Availability of community resources:  TBD based on needs.    Clinical Social Worker follow up needed: No.   Edwyna Shell, [place] Social Worker Phone:   [phone] Cell:  [phone]

## 2019-07-16 NOTE — Patient Instructions (Signed)
[facility] Discharge Instructions for Patients Receiving Chemotherapy  Today you received the following chemotherapy agents   To help prevent nausea and vomiting after your treatment, we encourage you to take your nausea medication   If you develop nausea and vomiting that is not controlled by your nausea medication, call the clinic.   BELOW ARE SYMPTOMS THAT SHOULD BE REPORTED IMMEDIATELY:  *FEVER GREATER THAN 100.5 F  *CHILLS WITH OR WITHOUT FEVER  NAUSEA AND VOMITING THAT IS NOT CONTROLLED WITH YOUR NAUSEA MEDICATION  *UNUSUAL SHORTNESS OF BREATH  *UNUSUAL BRUISING OR BLEEDING  TENDERNESS IN MOUTH AND THROAT WITH OR WITHOUT PRESENCE OF ULCERS  *URINARY PROBLEMS  *BOWEL PROBLEMS  UNUSUAL RASH Items with * indicate a potential emergency and should be followed up as soon as possible.  Feel free to call the clinic should you have any questions or concerns. The clinic phone number is [phone].  Please show the CHEMO ALERT CARD at check-in to the Emergency Department and triage nurse.   

## 2019-07-16 NOTE — Progress Notes (Signed)
07/16/19   Received order to give Elitek 6 mg IVPB x 1 today.  Orders entered.  T.O. Dr Maye Hides, RN/Cabrina Shiroma Ronnald Ramp, PharmD

## 2019-07-17 ENCOUNTER — Ambulatory Visit (HOSPITAL_COMMUNITY): Payer: Medicare HMO

## 2019-07-17 ENCOUNTER — Inpatient Hospital Stay (HOSPITAL_COMMUNITY): Payer: Medicare HMO

## 2019-07-17 ENCOUNTER — Inpatient Hospital Stay (HOSPITAL_BASED_OUTPATIENT_CLINIC_OR_DEPARTMENT_OTHER): Payer: Medicare HMO | Admitting: Hematology

## 2019-07-17 ENCOUNTER — Encounter (HOSPITAL_COMMUNITY): Payer: Self-pay | Admitting: *Deleted

## 2019-07-17 ENCOUNTER — Other Ambulatory Visit: Payer: Self-pay

## 2019-07-17 VITALS — BP 110/54 | HR 61 | Temp 97.0°F | Resp 18

## 2019-07-17 DIAGNOSIS — C3490 Malignant neoplasm of unspecified part of unspecified bronchus or lung: Secondary | ICD-10-CM

## 2019-07-17 DIAGNOSIS — Z5112 Encounter for antineoplastic immunotherapy: Secondary | ICD-10-CM | POA: Diagnosis not present

## 2019-07-17 LAB — COMPREHENSIVE METABOLIC PANEL
ALT: 81 U/L — ABNORMAL HIGH (ref 0–44)
AST: 120 U/L — ABNORMAL HIGH (ref 15–41)
Albumin: 3.3 g/dL — ABNORMAL LOW (ref 3.5–5.0)
Alkaline Phosphatase: 330 U/L — ABNORMAL HIGH (ref 38–126)
Anion gap: 10 (ref 5–15)
BUN: 65 mg/dL — ABNORMAL HIGH (ref 8–23)
CO2: 21 mmol/L — ABNORMAL LOW (ref 22–32)
Calcium: 10.6 mg/dL — ABNORMAL HIGH (ref 8.9–10.3)
Chloride: 106 mmol/L (ref 98–111)
Creatinine, Ser: 1.7 mg/dL — ABNORMAL HIGH (ref 0.61–1.24)
GFR calc Af Amer: 44 mL/min — ABNORMAL LOW (ref >60)
GFR calc non Af Amer: 38 mL/min — ABNORMAL LOW (ref >60)
Glucose, Bld: 153 mg/dL — ABNORMAL HIGH (ref 70–99)
Potassium: 4.7 mmol/L (ref 3.5–5.1)
Sodium: 137 mmol/L (ref 135–145)
Total Bilirubin: 1.3 mg/dL — ABNORMAL HIGH (ref 0.3–1.2)
Total Protein: 6.4 g/dL — ABNORMAL LOW (ref 6.5–8.1)

## 2019-07-17 LAB — CBC WITH DIFFERENTIAL/PLATELET
Abs Immature Granulocytes: 0.06 10*3/uL (ref 0.00–0.07)
Basophils Absolute: 0 10*3/uL (ref 0.0–0.1)
Basophils Relative: 0 %
Eosinophils Absolute: 0 10*3/uL (ref 0.0–0.5)
Eosinophils Relative: 0 %
HCT: 43 % (ref 39.0–52.0)
Hemoglobin: 13.8 g/dL (ref 13.0–17.0)
Immature Granulocytes: 0 %
Lymphocytes Relative: 5 %
Lymphs Abs: 0.7 10*3/uL (ref 0.7–4.0)
MCH: 30.1 pg (ref 26.0–34.0)
MCHC: 32.1 g/dL (ref 30.0–36.0)
MCV: 93.9 fL (ref 80.0–100.0)
Monocytes Absolute: 1.3 10*3/uL — ABNORMAL HIGH (ref 0.1–1.0)
Monocytes Relative: 8 %
Neutro Abs: 12.8 10*3/uL — ABNORMAL HIGH (ref 1.7–7.7)
Neutrophils Relative %: 87 %
Platelets: 296 10*3/uL (ref 150–400)
RBC: 4.58 MIL/uL (ref 4.22–5.81)
RDW: 15.3 % (ref 11.5–15.5)
WBC: 14.9 10*3/uL — ABNORMAL HIGH (ref 4.0–10.5)
nRBC: 0 % (ref 0.0–0.2)

## 2019-07-17 LAB — URIC ACID: Uric Acid, Serum: 4.5 mg/dL (ref 3.7–8.6)

## 2019-07-17 MED ORDER — SODIUM CHLORIDE 0.9 % IV SOLN: Freq: Once | INTRAVENOUS | Status: AC

## 2019-07-17 MED ORDER — PEGFILGRASTIM 6 MG/0.6ML ~~LOC~~ PSKT
6.0000 mg | PREFILLED_SYRINGE | Freq: Once | SUBCUTANEOUS | Status: AC
  Administered 2019-07-17: 6 mg via SUBCUTANEOUS

## 2019-07-17 MED ORDER — SODIUM CHLORIDE 0.9 % IV SOLN
10.0000 mg | Freq: Once | INTRAVENOUS | Status: AC
  Administered 2019-07-17: 10 mg via INTRAVENOUS
  Filled 2019-07-17: qty 10

## 2019-07-17 MED ORDER — SODIUM CHLORIDE 0.9 % IV SOLN
50.0000 mg/m2 | Freq: Once | INTRAVENOUS | Status: AC
  Administered 2019-07-17: 100 mg via INTRAVENOUS
  Filled 2019-07-17: qty 5

## 2019-07-17 MED ORDER — PEGFILGRASTIM 6 MG/0.6ML ~~LOC~~ PSKT
PREFILLED_SYRINGE | SUBCUTANEOUS | Status: AC
  Filled 2019-07-17: qty 0.6

## 2019-07-17 MED ORDER — SODIUM CHLORIDE 0.9 % IV SOLN: INTRAVENOUS | Status: AC

## 2019-07-17 NOTE — Patient Instructions (Signed)
[place] at [facility] Discharge Instructions  You were seen today by Dr. Delton Coombes. He went over your recent lab and scan results. He would like you come back on Friday for labs and fluids and Xgeva injection. He will see you back early next week for labs and follow up.   Thank you for choosing [place] at [facility] to provide your oncology and hematology care.  To afford each patient quality time with our provider, please arrive at least 15 minutes before your scheduled appointment time.   If you have a lab appointment with the [facility] please come in thru the  Main Entrance and check in at the main information desk  You need to re-schedule your appointment should you arrive 10 or more minutes late.  We strive to give you quality time with our providers, and arriving late affects you and other patients whose appointments are after yours.  Also, if you no show three or more times for appointments you may be dismissed from the clinic at the providers discretion.     Again, thank you for choosing [facility].  Our hope is that these requests will decrease the amount of time that you wait before being seen by our physicians.       _____________________________________________________________  Should you have questions after your visit to [facility], please contact our office at [phone] between the hours of 8:00 a.m. and 4:30 p.m.  Voicemails left after 4:00 p.m. will not be returned until the following business day.  For prescription refill requests, have your pharmacy contact our office and allow 72 hours.    Cancer Center Support Programs:   > Cancer Support Group  2nd Tuesday of the month 1pm-2pm, Journey Room

## 2019-07-17 NOTE — Patient Instructions (Signed)
[facility] Discharge Instructions for Patients Receiving Chemotherapy   Beginning January 23rd 2017 lab work for the [facility] will be done in the  Main lab at [facility] on 1st floor. If you have a lab appointment with the [facility] please come in thru the  Main Entrance and check in at the main information desk   Today you received the following chemotherapy agents Etoposide, IV hydration, and Neulasta On Pro  To help prevent nausea and vomiting after your treatment, we encourage you to take your nausea medication   If you develop nausea and vomiting, or diarrhea that is not controlled by your medication, call the clinic.  The clinic phone number is [phone]. Office hours are Monday-Friday 8:30am-5:00pm.  BELOW ARE SYMPTOMS THAT SHOULD BE REPORTED IMMEDIATELY:  *FEVER GREATER THAN 101.0 F  *CHILLS WITH OR WITHOUT FEVER  NAUSEA AND VOMITING THAT IS NOT CONTROLLED WITH YOUR NAUSEA MEDICATION  *UNUSUAL SHORTNESS OF BREATH  *UNUSUAL BRUISING OR BLEEDING  TENDERNESS IN MOUTH AND THROAT WITH OR WITHOUT PRESENCE OF ULCERS  *URINARY PROBLEMS  *BOWEL PROBLEMS  UNUSUAL RASH Items with * indicate a potential emergency and should be followed up as soon as possible. If you have an emergency after office hours please contact your primary care physician or go to the nearest emergency department.  Please call the clinic during office hours if you have any questions or concerns.   You may also contact the Patient Navigator at [phone] should you have any questions or need assistance in obtaining follow up care.      Resources For Cancer Patients and their Caregivers ? American Cancer Society: Can assist with transportation, wigs, general needs, runs Look Good Feel Better.        506-023-8264 ? Cancer Care: Provides financial assistance, online support groups, medication/co-pay assistance.  1-800-813-HOPE (443)051-8199) ? [facility] Assists [place] cancer patients and their families through emotional , educational and financial support.  573-803-8912 ? [place] DSS Where to apply for food stamps, Medicaid and utility assistance. 224 768 2465 ? [facility]: Transportation to medical appointments. (905)569-9843 ? Social Security Administration: May apply for disability if have a Stage IV cancer. ([phone] 640-120-7930 ? [employer], Disability and Transit Services: Assists with nutrition, care and transit needs. 231-670-9199

## 2019-07-17 NOTE — Progress Notes (Signed)
Asencion Gowda tolerated day 3 VP-16 and hydration without incident or complaint. VSS. Peripheral IV noted for positive blood return by 2 RNs prior to, during, and after infusions. No swelling, bruising, or pain reported or noted at site. Discharged in satisfactory condition in company of wife with follow up instructions.

## 2019-07-17 NOTE — Progress Notes (Signed)
I received call report from [place] at [facility] on patients CT head from yesterday.    IMPRESSION: 11 x 14 mm cystic mass in the left posterior frontal lobe likely in the motor strip. Correlate with right-sided symptoms. This was not present on the prior study. Although this shows minimal if any enhancement this is likely a cystic metastasis.  12 mm lytic lesion left parietal bone shows enlargement since the prior CT and could represent metastatic disease.  Dr. Delton Coombes aware, no orders at this time.

## 2019-07-17 NOTE — Progress Notes (Signed)
Patient has been assessed, vital signs and labs have been reviewed by Dr. Katragadda. ANC, Creatinine, LFTs, and Platelets are within treatment parameters per Dr. Katragadda. The patient is good to proceed with treatment at this time.  

## 2019-07-17 NOTE — Assessment & Plan Note (Signed)
1.  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  Small but hypermetabolic lymph nodes in the neck.  Small but new/enlarging pulmonary nodules below since 2 PET CT size. -Biopsy of left submandibular lymph node consistent with small cell cancer. -Cycle 1 of carboplatin, VP-16 and atezolizumab on 07/15/2019. -He started feeling slightly better. -As his uric acid was elevated, have recommended rasburicase. -He has mild hypercalcemia which could be contributing to his tiredness.  I have recommended denosumab. -We will continue hydration today and bring him back on Friday to give more hydration along with his labs. -I have reviewed his labs today which showed elevated creatinine of 1.7.  Calcium is 10.6.  AST and ALT are elevated.  Total bilirubin improved to 1.4.   2.  Back pain/left shoulder pain/right-sided rib pain: -Continue fentanyl 25 mcg patch.  Continue oxycodone 10 mg every 4 hours as needed.  3.  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 -We will check PSA level which was undetectable.  4.  Atrial fibrillation: -Continue Eliquis twice daily.  He has a pacemaker.  5.  Insomnia: -Continue Ambien at bedtime.  6.  Family history: -Brother died of lung cancer and is a smoker.  Sister died of ovarian cancer.  Genetic testing was negative.  7.  Constipation: -Continue stool softeners and lactulose.  8.  Nutrition: -I have strongly encouraged him to drink Ensure or boost.  He is not eating much solid foods.  9.  Brain metastasis: -We reviewed results of the CT of the head with and without contrast which showed 11 x 14 mm cystic mass in the left posterior frontal cortex over the convexity.  Patient does not have any symptoms from this.  Lytic lesion in left parietal bone was present in  August 2019 and may have some very slightly increased. -I plan to rescan his brain after 2 cycles.

## 2019-07-17 NOTE — Progress Notes (Signed)
Douglas Baxter, [place] [address]   CLINIC:  Medical Oncology/Hematology  PCP:  Frederic Jericho, NP [address] [place] [address] [phone]   REASON FOR VISIT:  Follow-up for extensive stage small cell carcinoma with metastasis to the liver and bones.  PRIOR THERAPY: 1.  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  NGS Results: Not done  CURRENT THERAPY: Carboplatin, etoposide and atezolizumab.  BRIEF ONCOLOGIC HISTORY:  Oncology History  Prostate cancer ([place])  06/24/2013 Initial Diagnosis   Prostate cancer ([place])   02/22/2018 Genetic Testing   Negative genetic testing on the common hereditary cancer panel.  The Hereditary Gene Panel offered by [facility] includes sequencing and/or deletion duplication testing of the following 47 genes: APC, ATM, AXIN2, BARD1, BMPR1A, BRCA1, BRCA2, BRIP1, CDH1, CDK4, CDKN2A (p14ARF), CDKN2A (p16INK4a), CHEK2, CTNNA1, DICER1, EPCAM (Deletion/duplication testing only), GREM1 (promoter region deletion/duplication testing only), KIT, MEN1, MLH1, MSH2, MSH3, MSH6, MUTYH, NBN, NF1, NHTL1, PALB2, PDGFRA, PMS2, POLD1, POLE, PTEN, RAD50, RAD51C, RAD51D, RET, SDHB, SDHC, SDHD, SMAD4, SMARCA4. STK11, TP53, TSC1, TSC2, and VHL.  The following genes were evaluated for sequence changes only: SDHA and HOXB13 c.251G>A variant only. The report date is February 22, 2018.   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[place])  07/27/2017 PET scan   Left lower lobe lung mass with SUV of 14.6, probable metastatic left hilar lymph node, no evidence of distant metastasis   08/17/2017 Surgery   VATS left upper lobe lobectomy, mediastinal lymphadenectomy, pathology  consistent with 2.6 cm x 2.1 x 2 cm large cell neuroendocrine carcinoma, station 5, station 6, station 11 lymph nodes not involved, 2/9 peribronchial lymph nodes positive for large cell neuroendocrine carcinoma, negative visceral pleural involvement   10/16/2017 - 01/08/2018 Chemotherapy   The patient had palonosetron (ALOXI) injection 0.25 mg, 0.25 mg, Intravenous,  Once, 4 of 4 cycles Administration: 0.25 mg (10/16/2017), 0.25 mg (11/07/2017), 0.25 mg (11/28/2017), 0.25 mg (12/19/2017) pegfilgrastim (NEULASTA ONPRO KIT) injection 6 mg, 6 mg, Subcutaneous, Once, 3 of 3 cycles Administration: 6 mg (10/18/2017), 6 mg (11/09/2017), 6 mg (11/30/2017) pegfilgrastim-cbqv (UDENYCA) injection 6 mg, 6 mg, Subcutaneous, Once, 1 of 1 cycle Administration: 6 mg (12/22/2017) CARBOplatin (PARAPLATIN) 500 mg in sodium chloride 0.9 % 250 mL chemo infusion, 500 mg (100 % of original dose 497 mg), Intravenous,  Once, 4 of 4 cycles Dose modification:   (original dose 497 mg, Cycle 1),   (original dose 467.5 mg, Cycle 2),   (original dose 470 mg, Cycle 3),   (original dose 437.5 mg, Cycle 4) Administration: 500 mg (10/16/2017), 470 mg (11/07/2017), 470 mg (11/28/2017), 440 mg (12/19/2017) etoposide (VEPESID) 200 mg in sodium chloride 0.9 % 500 mL chemo infusion, 90 mg/m2 = 220 mg, Intravenous,  Once, 4 of 4 cycles Administration: 200 mg (10/16/2017), 200 mg (10/17/2017), 200 mg (10/18/2017), 200 mg (11/07/2017), 200 mg (11/08/2017), 200 mg (11/09/2017), 200 mg (11/28/2017), 200 mg (11/29/2017), 200 mg (11/30/2017), 200 mg (12/19/2017), 200 mg (12/20/2017), 200 mg (12/21/2017) fosaprepitant (EMEND) 150 mg, dexamethasone (DECADRON) 12 mg in sodium chloride 0.9 % 145 mL IVPB, , Intravenous,  Once, 4 of 4 cycles Administration:  (10/16/2017),  (11/07/2017),  (11/28/2017),  (12/19/2017)  for chemotherapy treatment.    02/22/2018 Genetic Testing   Negative genetic testing  on the common hereditary cancer panel.  The Hereditary Gene Panel offered by  [facility] includes sequencing and/or deletion duplication testing of the following 47 genes: APC, ATM, AXIN2, BARD1, BMPR1A, BRCA1, BRCA2, BRIP1, CDH1, CDK4, CDKN2A (p14ARF), CDKN2A (p16INK4a), CHEK2, CTNNA1, DICER1, EPCAM (Deletion/duplication testing only), GREM1 (promoter region deletion/duplication testing only), KIT, MEN1, MLH1, MSH2, MSH3, MSH6, MUTYH, NBN, NF1, NHTL1, PALB2, PDGFRA, PMS2, POLD1, POLE, PTEN, RAD50, RAD51C, RAD51D, RET, SDHB, SDHC, SDHD, SMAD4, SMARCA4. STK11, TP53, TSC1, TSC2, and VHL.  The following genes were evaluated for sequence changes only: SDHA and HOXB13 c.251G>A variant only. The report date is February 22, 2018.   Small cell carcinoma of lung ([place])  07/11/2019 Initial Diagnosis   Small cell carcinoma of lung ([place])   07/15/2019 -  Chemotherapy   The patient had palonosetron (ALOXI) injection 0.25 mg, 0.25 mg, Intravenous,  Once, 1 of 4 cycles Administration: 0.25 mg (07/15/2019) pegfilgrastim (NEULASTA ONPRO KIT) injection 6 mg, 6 mg, Subcutaneous, Once, 1 of 4 cycles CARBOplatin (PARAPLATIN) 350 mg in sodium chloride 0.9 % 250 mL chemo infusion, 350 mg (100 % of original dose 349.5 mg), Intravenous,  Once, 1 of 4 cycles Dose modification:   (original dose 349.5 mg, Cycle 1) Administration: 350 mg (07/15/2019) etoposide (VEPESID) 100 mg in sodium chloride 0.9 % 500 mL chemo infusion, 50 mg/m2 = 100 mg (100 % of original dose 50 mg/m2), Intravenous,  Once, 1 of 4 cycles Dose modification: 50 mg/m2 (original dose 50 mg/m2, Cycle 1, Reason: Change in LFTs), 50 mg/m2 (50 % of original dose 100 mg/m2, Cycle 1, Reason: Change in LFTs) Administration: 100 mg (07/15/2019), 100 mg (07/16/2019) fosaprepitant (EMEND) 150 mg in sodium chloride 0.9 % 145 mL IVPB, 150 mg, Intravenous,  Once, 1 of 4 cycles Administration: 150 mg (07/15/2019) atezolizumab (TECENTRIQ) 1,200 mg in sodium chloride 0.9 % 250 mL chemo infusion, 1,200 mg, Intravenous, Once, 1 of 8 cycles Administration: 1,200 mg  (07/15/2019)  for chemotherapy treatment.      CANCER STAGING: Cancer Staging No matching staging information was found for the patient.   INTERVAL HISTORY:  Douglas Baxter 79 y.o. male seen for follow-up of extensive metastatic small cell cancer.  He is here for day 3 of chemotherapy.  He had done CT scan of the brain.  Appetite and energy levels are 25%.  Pain in the lower back is reported as 4 out of 10 and better controlled.  Reports shortness of breath on exertion.  Also has some constipation.  He is having trouble eating as his appetite is low.  He is not drinking much of fluids.    REVIEW OF SYSTEMS:  Review of Systems  HENT:   Positive for trouble swallowing.   Respiratory: Positive for shortness of breath.   Gastrointestinal: Positive for constipation.  Neurological: Positive for dizziness.  Psychiatric/Behavioral: Positive for sleep disturbance.  All other systems reviewed and are negative.    PAST MEDICAL/SURGICAL HISTORY:  Past Medical History:  Diagnosis Date  . Arthritis   . Cancer [facility])    prostate  . Family history of lung cancer   . Family history of ovarian cancer   . Hematuria   . Nocturia    Past Surgical History:  Procedure Laterality Date  . [place]   lower back  . CHOLECYSTECTOMY  2000  . LYMPHADENECTOMY Bilateral 06/24/2013   Procedure: LYMPHADENECTOMY;  Surgeon: [name], MD;  Location: [facility];  Service: Urology;  Laterality: Bilateral;  . MENISCUS REPAIR Right jan  2014  . PROSTATE BIOPSY  Apr 26, 2013  . ROBOT ASSISTED LAPAROSCOPIC RADICAL PROSTATECTOMY N/A 06/24/2013   Procedure: ROBOTIC ASSISTED LAPAROSCOPIC RADICAL PROSTATECTOMY LEVEL 2;  Surgeon: [name], MD;  Location: [facility];  Service: Urology;  Laterality: N/A;     SOCIAL HISTORY:  Social History   Socioeconomic History  . Marital status: Married    Spouse name: Not on file  . Number of children: Not on file  . Years of education: Not on file  . Highest education  level: Not on file  Occupational History  . Not on file  Tobacco Use  . Smoking status: Former Smoker    Packs/day: 1.00    Types: Cigarettes    Quit date: 03/07/1978    Years since quitting: 41.3  . Smokeless tobacco: Never Used  Substance and Sexual Activity  . Alcohol use: Yes    Comment:  occasional  . Drug use: No  . Sexual activity: Not on file  Other Topics Concern  . Not on file  Social History Narrative  . Not on file   Social Determinants of Health   Financial Resource Strain:   . Difficulty of Paying Living Expenses:   Food Insecurity:   . Worried About Charity fundraiser in the Last Year:   . Arboriculturist in the Last Year:   Transportation Needs:   . Film/video editor (Medical):   Marland Kitchen Lack of Transportation (Non-Medical):   Physical Activity:   . Days of Exercise per Week:   . Minutes of Exercise per Session:   Stress:   . Feeling of Stress :   Social Connections:   . Frequency of Communication with Friends and Family:   . Frequency of Social Gatherings with Friends and Family:   . Attends Religious Services:   . Active Member of Clubs or Organizations:   . Attends Archivist Meetings:   Marland Kitchen Marital Status:   Intimate Partner Violence:   . Fear of Current or Ex-Partner:   . Emotionally Abused:   Marland Kitchen Physically Abused:   . Sexually Abused:     FAMILY HISTORY:  Family History  Problem Relation Age of Onset  . Deep vein thrombosis Mother   . Heart failure Father   . Ovarian cancer Sister 10  . Lung cancer Brother 52  . Cancer Maternal Aunt        NOS    CURRENT MEDICATIONS:  Outpatient Encounter Medications as of 07/17/2019  Medication Sig Note  . allopurinol (ZYLOPRIM) 300 MG tablet Take 1 tablet (300 mg total) by mouth daily.   Marland Kitchen apixaban (ELIQUIS) 5 MG TABS tablet Take 5 mg by mouth 2 (two) times daily.   [name] (TECENTRIQ IV) Inject 1,200 mg into the vein every 21 ( twenty-one) days.   . CARBOPLATIN IV Inject into the  vein every 21 ( twenty-one) days.   . ETOPOSIDE IV Inject 100 mg/m2 into the vein every 21 ( twenty-one) days.   . fentaNYL (DURAGESIC) 25 MCG/HR Place 1 patch onto the skin every 3 (three) days.   . furosemide (LASIX) 20 MG tablet Take 20 mg by mouth daily.    Marland Kitchen gabapentin (NEURONTIN) 300 MG capsule Take 1 capsule (300 mg total) by mouth 2 (two) times daily. 07/05/2019: Has not started yet  . Lactulose 20 GM/30ML SOLN Take 30 mLs (20 g total) by mouth at bedtime. 07/05/2019: Has not started yet  . magic mouthwash SOLN Take 5 mLs  by mouth 4 (four) times daily.   . metoprolol tartrate (LOPRESSOR) 25 MG tablet Take 25 mg by mouth daily.    . Misc. Devices MISC Please provide patient with a walker.  He is 72" tall.   . Oxycodone HCl 10 MG TABS Take 1 tablet (10 mg total) by mouth every 4 (four) hours as needed.   Vladimir Faster Glycol-Propyl Glycol (SYSTANE) 0.4-0.3 % GEL ophthalmic gel Place 1 application into both eyes in the morning and at bedtime.   Marland Kitchen HYDROcodone-acetaminophen (NORCO/VICODIN) 5-325 MG tablet Take 1 tablet by mouth every 4 (four) hours as needed for moderate pain. (Patient not taking: Reported on 07/17/2019)   . prochlorperazine (COMPAZINE) 10 MG tablet Take 1 tablet (10 mg total) by mouth every 6 (six) hours as needed (Nausea or vomiting). (Patient not taking: Reported on 07/17/2019)   . zolpidem (AMBIEN) 10 MG tablet TAKE 1 TABLET BY MOUTH EVERY DAY AT BEDTIME AS NEEDED FOR SLEEP (Patient not taking: Reported on 07/17/2019)   . [EXPIRED] 0.9 %  sodium chloride infusion    . [EXPIRED] 0.9 %  sodium chloride infusion    . [EXPIRED] dexamethasone (DECADRON) 10 mg in sodium chloride 0.9 % 50 mL IVPB    . [EXPIRED] etoposide (VEPESID) 100 mg in sodium chloride 0.9 % 500 mL chemo infusion    . [EXPIRED] pegfilgrastim (NEULASTA ONPRO KIT) injection 6 mg     No facility-administered encounter medications on file as of 07/17/2019.    ALLERGIES:  Allergies  Allergen Reactions  . Diltiazem     . Sulfa Antibiotics Nausea And Vomiting     PHYSICAL EXAM:  ECOG Performance status: 2  Vitals:   07/17/19 1142  BP: (!) 100/49  Pulse: 81  Resp: 20  Temp: (!) 96.9 F (36.1 C)  SpO2: 95%   Filed Weights   07/17/19 1142 07/17/19 1204  Weight: 174 lb 6.4 oz (79.1 kg) 174 lb 6.4 oz (79.1 kg)   Physical Exam Vitals reviewed.  Constitutional:      Appearance: Normal appearance.  HENT:     Head: Normocephalic and atraumatic.  Cardiovascular:     Rate and Rhythm: Normal rate and regular rhythm.     Heart sounds: Normal heart sounds.  Pulmonary:     Effort: Pulmonary effort is normal.     Breath sounds: Normal breath sounds.  Lymphadenopathy:     Cervical: Cervical adenopathy present.  Skin:    General: Skin is warm.  Neurological:     General: No focal deficit present.     Mental Status: He is alert and oriented to person, place, and time.  Psychiatric:        Mood and Affect: Mood normal.        Behavior: Behavior normal.      LABORATORY DATA:  I have reviewed the labs as listed.  CBC    Component Value Date/Time   WBC 14.9 (H) 07/17/2019 1140   RBC 4.58 07/17/2019 1140   HGB 13.8 07/17/2019 1140   HCT 43.0 07/17/2019 1140   PLT 296 07/17/2019 1140   MCV 93.9 07/17/2019 1140   MCH 30.1 07/17/2019 1140   MCHC 32.1 07/17/2019 1140   RDW 15.3 07/17/2019 1140   LYMPHSABS 0.7 07/17/2019 1140   MONOABS 1.3 (H) 07/17/2019 1140   EOSABS 0.0 07/17/2019 1140   BASOSABS 0.0 07/17/2019 1140   CMP Latest Ref Rng & Units 07/17/2019 07/15/2019 04/01/2019  Glucose 70 - 99 mg/dL 153(H) 119(H) 100(H)  BUN 8 -  23 mg/dL 65(H) 37(H) 18  Creatinine 0.61 - 1.24 mg/dL 1.70(H) 1.57(H) 1.21  Sodium 135 - 145 mmol/L 137 135 135  Potassium 3.5 - 5.1 mmol/L 4.7 4.5 4.7  Chloride 98 - 111 mmol/L 106 99 99  CO2 22 - 32 mmol/L 21(L) 23 28  Calcium 8.9 - 10.3 mg/dL 10.6(H) 12.1(H) 9.3  Total Protein 6.5 - 8.1 g/dL 6.4(L) 7.2 7.2  Total Bilirubin 0.3 - 1.2 mg/dL 1.3(H) 1.9(H)  1.3(H)  Alkaline Phos 38 - 126 U/L 330(H) 374(H) 78  AST 15 - 41 U/L 120(H) 97(H) 20  ALT 0 - 44 U/L 81(H) 68(H) 16    DIAGNOSTIC IMAGING:  I have independently reviewed scans.  ASSESSMENT & PLAN:  Small cell carcinoma of lung ([place]) 1.  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  Small but hypermetabolic lymph nodes in the neck.  Small but new/enlarging pulmonary nodules below since 2 PET CT size. -Biopsy of left submandibular lymph node consistent with small cell cancer. -Cycle 1 of carboplatin, VP-16 and atezolizumab on 07/15/2019. -He started feeling slightly better. -As his uric acid was elevated, have recommended rasburicase. -He has mild hypercalcemia which could be contributing to his tiredness.  I have recommended denosumab. -We will continue hydration today and bring him back on Friday to give more hydration along with his labs. -I have reviewed his labs today which showed elevated creatinine of 1.7.  Calcium is 10.6.  AST and ALT are elevated.  Total bilirubin improved to 1.4.   2.  Back pain/left shoulder pain/right-sided rib pain: -Continue fentanyl 25 mcg patch.  Continue oxycodone 10 mg every 4 hours as needed.  3.  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 -We will check PSA level which was undetectable.  4.  Atrial fibrillation: -Continue Eliquis twice daily.  He has a pacemaker.  5.  Insomnia: -Continue Ambien at bedtime.  6.  Family history: -Brother died of lung cancer and is a smoker.  Sister died of ovarian cancer.  Genetic testing was negative.  7.  Constipation: -Continue stool softeners and lactulose.  8.  Nutrition: -I have strongly encouraged him to drink Ensure or boost.  He is not eating much solid foods.  9.  Brain metastasis: -We reviewed  results of the CT of the head with and without contrast which showed 11 x 14 mm cystic mass in the left posterior frontal cortex over the convexity.  Patient does not have any symptoms from this.  Lytic lesion in left parietal bone was present in August 2019 and may have some very slightly increased. -I plan to rescan his brain after 2 cycles.     Orders placed this encounter:  No orders of the defined types were placed in this encounter.    Derek Jack, MD [place] [phone]

## 2019-07-18 NOTE — Progress Notes (Signed)
07/18/19 @ 13:35 called patient for 24 hour call back. Unable to reach patient. Message left on the mobile. Instructed patient to call with any questions or concerns at [phone].

## 2019-07-18 NOTE — Progress Notes (Signed)
13:40 Patient's wife called back to the unit. Wife states he has had no issues with nausea, vomiting, or diarrhea. Patient ate a good breakfast per his wife and will be coming in on Friday for fluids and XGeva. Patient's wife said he is drowsy and tired.

## 2019-07-19 ENCOUNTER — Inpatient Hospital Stay (HOSPITAL_COMMUNITY): Payer: Medicare HMO

## 2019-07-19 ENCOUNTER — Other Ambulatory Visit: Payer: Self-pay

## 2019-07-19 ENCOUNTER — Encounter (HOSPITAL_COMMUNITY): Payer: Self-pay

## 2019-07-19 VITALS — BP 106/61 | HR 77 | Temp 96.9°F | Resp 18

## 2019-07-19 DIAGNOSIS — C3490 Malignant neoplasm of unspecified part of unspecified bronchus or lung: Secondary | ICD-10-CM

## 2019-07-19 DIAGNOSIS — Z5112 Encounter for antineoplastic immunotherapy: Secondary | ICD-10-CM | POA: Diagnosis not present

## 2019-07-19 DIAGNOSIS — E222 Syndrome of inappropriate secretion of antidiuretic hormone: Secondary | ICD-10-CM

## 2019-07-19 LAB — COMPREHENSIVE METABOLIC PANEL
ALT: 96 U/L — ABNORMAL HIGH (ref 0–44)
AST: 135 U/L — ABNORMAL HIGH (ref 15–41)
Albumin: 3.3 g/dL — ABNORMAL LOW (ref 3.5–5.0)
Alkaline Phosphatase: 320 U/L — ABNORMAL HIGH (ref 38–126)
Anion gap: 12 (ref 5–15)
BUN: 55 mg/dL — ABNORMAL HIGH (ref 8–23)
CO2: 23 mmol/L (ref 22–32)
Calcium: 10.2 mg/dL (ref 8.9–10.3)
Chloride: 103 mmol/L (ref 98–111)
Creatinine, Ser: 1.28 mg/dL — ABNORMAL HIGH (ref 0.61–1.24)
GFR calc Af Amer: >60 mL/min (ref >60)
GFR calc non Af Amer: 54 mL/min — ABNORMAL LOW (ref >60)
Glucose, Bld: 108 mg/dL — ABNORMAL HIGH (ref 70–99)
Potassium: 4.3 mmol/L (ref 3.5–5.1)
Sodium: 138 mmol/L (ref 135–145)
Total Bilirubin: 1.6 mg/dL — ABNORMAL HIGH (ref 0.3–1.2)
Total Protein: 6.4 g/dL — ABNORMAL LOW (ref 6.5–8.1)

## 2019-07-19 LAB — MAGNESIUM: Magnesium: 2.3 mg/dL (ref 1.7–2.4)

## 2019-07-19 LAB — URIC ACID: Uric Acid, Serum: 2.5 mg/dL — ABNORMAL LOW (ref 3.7–8.6)

## 2019-07-19 MED ORDER — DENOSUMAB 120 MG/1.7ML ~~LOC~~ SOLN
120.0000 mg | Freq: Once | SUBCUTANEOUS | Status: AC
  Administered 2019-07-19: 120 mg via SUBCUTANEOUS
  Filled 2019-07-19: qty 1.7

## 2019-07-19 MED ORDER — SODIUM CHLORIDE 0.9 % IV SOLN: INTRAVENOUS | Status: DC

## 2019-07-19 NOTE — Progress Notes (Signed)
Labs reviewed with MD today. No additional orders at this time.    Will start xgeva today per MD. Will get consent, give education information to patient and wife and instruct patient to take calcium 1200mg  daily per MD.   Hydration given per orders. Patient tolerated it well without problems. Vitals stable and discharged home from clinic via wheelchair. Follow up as scheduled.

## 2019-07-19 NOTE — Progress Notes (Signed)
Nutrition  Patient and wife in clinic this afternoon for fluids.  Appetite is poor.    Wife with questions regarding protein powder.  Patient reports film in his mouth. Does not like the ensure/boost shakes.   Discussed protein powder options with wife.  Gave smoothie recipe to try.  Provided baking soda, salt water rinse recipe as well.   Jessiah Wojnar B. Zenia Resides, [place], [place] Registered Dietitian [phone] (pager)

## 2019-07-19 NOTE — Patient Instructions (Signed)
[facility]  Discharge Instructions:   _______________________________________________________________  Thank you for choosing [facility] at [facility] to provide your oncology and hematology care.  To afford each patient quality time with our providers, please arrive at least 15 minutes before your scheduled appointment.  You need to re-schedule your appointment if you arrive 10 or more minutes late.  We strive to give you quality time with our providers, and arriving late affects you and other patients whose appointments are after yours.  Also, if you no show three or more times for appointments you may be dismissed from the clinic.  Again, thank you for choosing [facility]. Our hope is that these requests will allow you access to exceptional care and in a timely manner. _______________________________________________________________  If you have questions after your visit, please contact our office at [phone] between the hours of 8:30 a.m. and 5:00 p.m. Voicemails left after 4:30 p.m. will not be returned until the following business day. _______________________________________________________________  For prescription refill requests, have your pharmacy contact our office. _______________________________________________________________  Recommendations made by the consultant and any test results will be sent to your referring physician. _______________________________________________________________ 

## 2019-07-22 ENCOUNTER — Inpatient Hospital Stay (HOSPITAL_COMMUNITY): Payer: Medicare HMO | Admitting: Hematology

## 2019-07-22 ENCOUNTER — Inpatient Hospital Stay (HOSPITAL_COMMUNITY): Payer: Medicare HMO

## 2019-07-22 ENCOUNTER — Other Ambulatory Visit (HOSPITAL_COMMUNITY): Payer: Self-pay | Admitting: *Deleted

## 2019-07-22 ENCOUNTER — Other Ambulatory Visit: Payer: Self-pay

## 2019-07-22 DIAGNOSIS — C3490 Malignant neoplasm of unspecified part of unspecified bronchus or lung: Secondary | ICD-10-CM

## 2019-07-22 DIAGNOSIS — C7A8 Other malignant neuroendocrine tumors: Secondary | ICD-10-CM

## 2019-07-22 DIAGNOSIS — Z5112 Encounter for antineoplastic immunotherapy: Secondary | ICD-10-CM | POA: Diagnosis not present

## 2019-07-22 LAB — CBC WITH DIFFERENTIAL/PLATELET
Abs Immature Granulocytes: 0.86 10*3/uL — ABNORMAL HIGH (ref 0.00–0.07)
Basophils Absolute: 0.1 10*3/uL (ref 0.0–0.1)
Basophils Relative: 1 %
Eosinophils Absolute: 0.1 10*3/uL (ref 0.0–0.5)
Eosinophils Relative: 0 %
HCT: 41.4 % (ref 39.0–52.0)
Hemoglobin: 13.6 g/dL (ref 13.0–17.0)
Immature Granulocytes: 6 %
Lymphocytes Relative: 9 %
Lymphs Abs: 1.3 10*3/uL (ref 0.7–4.0)
MCH: 30 pg (ref 26.0–34.0)
MCHC: 32.9 g/dL (ref 30.0–36.0)
MCV: 91.4 fL (ref 80.0–100.0)
Monocytes Absolute: 0.2 10*3/uL (ref 0.1–1.0)
Monocytes Relative: 2 %
Neutro Abs: 12 10*3/uL — ABNORMAL HIGH (ref 1.7–7.7)
Neutrophils Relative %: 82 %
Platelets: 84 10*3/uL — ABNORMAL LOW (ref 150–400)
RBC: 4.53 MIL/uL (ref 4.22–5.81)
RDW: 14.4 % (ref 11.5–15.5)
WBC: 14.5 10*3/uL — ABNORMAL HIGH (ref 4.0–10.5)
nRBC: 0 % (ref 0.0–0.2)

## 2019-07-22 LAB — COMPREHENSIVE METABOLIC PANEL
ALT: 74 U/L — ABNORMAL HIGH (ref 0–44)
AST: 78 U/L — ABNORMAL HIGH (ref 15–41)
Albumin: 3.3 g/dL — ABNORMAL LOW (ref 3.5–5.0)
Alkaline Phosphatase: 332 U/L — ABNORMAL HIGH (ref 38–126)
Anion gap: 10 (ref 5–15)
BUN: 33 mg/dL — ABNORMAL HIGH (ref 8–23)
CO2: 24 mmol/L (ref 22–32)
Calcium: 8.5 mg/dL — ABNORMAL LOW (ref 8.9–10.3)
Chloride: 103 mmol/L (ref 98–111)
Creatinine, Ser: 0.93 mg/dL (ref 0.61–1.24)
GFR calc Af Amer: >60 mL/min (ref >60)
GFR calc non Af Amer: >60 mL/min (ref >60)
Glucose, Bld: 102 mg/dL — ABNORMAL HIGH (ref 70–99)
Potassium: 3.8 mmol/L (ref 3.5–5.1)
Sodium: 137 mmol/L (ref 135–145)
Total Bilirubin: 1.8 mg/dL — ABNORMAL HIGH (ref 0.3–1.2)
Total Protein: 6.1 g/dL — ABNORMAL LOW (ref 6.5–8.1)

## 2019-07-22 LAB — URIC ACID: Uric Acid, Serum: 3.4 mg/dL — ABNORMAL LOW (ref 3.7–8.6)

## 2019-07-22 MED ORDER — FENTANYL 50 MCG/HR TD PT72: 1.0000 | MEDICATED_PATCH | TRANSDERMAL | 0 refills | Status: AC

## 2019-07-22 MED ORDER — SODIUM CHLORIDE 0.9 % IV SOLN: INTRAVENOUS | Status: DC

## 2019-07-22 MED ORDER — FENTANYL 50 MCG/HR TD PT72: 1.0000 | MEDICATED_PATCH | TRANSDERMAL | 0 refills | Status: DC

## 2019-07-22 NOTE — Progress Notes (Signed)
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  PRIOR THERAPY: 1.  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  NGS Results: Not done  CURRENT THERAPY: Carboplatin, etoposide and atezolizumab.  BRIEF ONCOLOGIC HISTORY:  Oncology History  Prostate cancer ([place])  06/24/2013 Initial Diagnosis   Prostate cancer ([place])   02/22/2018 Genetic Testing   Negative genetic testing on the common hereditary cancer panel.  The Hereditary Gene Panel offered by [facility] includes sequencing and/or deletion duplication testing of the following 47 genes: APC, ATM, AXIN2, BARD1, BMPR1A, BRCA1, BRCA2, BRIP1, CDH1, CDK4, CDKN2A (p14ARF), CDKN2A (p16INK4a), CHEK2, CTNNA1, DICER1, EPCAM (Deletion/duplication testing only), GREM1 (promoter region deletion/duplication testing only), KIT, MEN1, MLH1, MSH2, MSH3, MSH6, MUTYH, NBN, NF1, NHTL1, PALB2, PDGFRA, PMS2, POLD1, POLE, PTEN, RAD50, RAD51C, RAD51D, RET, SDHB, SDHC, SDHD, SMAD4, SMARCA4. STK11, TP53, TSC1, TSC2, and VHL.  The following genes were evaluated for sequence changes only: SDHA and HOXB13 c.251G>A variant only. The report date is February 22, 2018.   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[place])  07/27/2017 PET scan   Left lower lobe lung mass with SUV of 14.6, probable metastatic left hilar lymph node, no evidence of distant metastasis   08/17/2017 Surgery   VATS left upper lobe lobectomy, mediastinal lymphadenectomy, pathology  consistent with 2.6 cm x 2.1 x 2 cm large cell neuroendocrine carcinoma, station 5, station 6, station 11 lymph nodes not involved, 2/9 peribronchial lymph nodes positive for large cell neuroendocrine carcinoma, negative visceral pleural involvement   10/16/2017 - 01/08/2018 Chemotherapy   The patient had palonosetron (ALOXI) injection 0.25 mg, 0.25 mg, Intravenous,  Once, 4 of 4 cycles Administration: 0.25 mg (10/16/2017), 0.25 mg (11/07/2017), 0.25 mg (11/28/2017), 0.25 mg (12/19/2017) pegfilgrastim (NEULASTA ONPRO KIT) injection 6 mg, 6 mg, Subcutaneous, Once, 3 of 3 cycles Administration: 6 mg (10/18/2017), 6 mg (11/09/2017), 6 mg (11/30/2017) pegfilgrastim-cbqv (UDENYCA) injection 6 mg, 6 mg, Subcutaneous, Once, 1 of 1 cycle Administration: 6 mg (12/22/2017) CARBOplatin (PARAPLATIN) 500 mg in sodium chloride 0.9 % 250 mL chemo infusion, 500 mg (100 % of original dose 497 mg), Intravenous,  Once, 4 of 4 cycles Dose modification:   (original dose 497 mg, Cycle 1),   (original dose 467.5 mg, Cycle 2),   (original dose 470 mg, Cycle 3),   (original dose 437.5 mg, Cycle 4) Administration: 500 mg (10/16/2017), 470 mg (11/07/2017), 470 mg (11/28/2017), 440 mg (12/19/2017) etoposide (VEPESID) 200 mg in sodium chloride 0.9 % 500 mL chemo infusion, 90 mg/m2 = 220 mg, Intravenous,  Once, 4 of 4 cycles Administration: 200 mg (10/16/2017), 200 mg (10/17/2017), 200 mg (10/18/2017), 200 mg (11/07/2017), 200 mg (11/08/2017), 200 mg (11/09/2017), 200 mg (11/28/2017), 200 mg (11/29/2017), 200 mg (11/30/2017), 200 mg (12/19/2017), 200 mg (12/20/2017), 200 mg (12/21/2017) fosaprepitant (EMEND) 150 mg, dexamethasone (DECADRON) 12 mg in sodium chloride 0.9 % 145 mL IVPB, , Intravenous,  Once, 4 of 4 cycles Administration:  (10/16/2017),  (11/07/2017),  (11/28/2017),  (12/19/2017)  for chemotherapy treatment.    02/22/2018 Genetic Testing   Negative genetic testing  on the common hereditary cancer panel.  The Hereditary Gene Panel offered by  [facility] includes sequencing and/or deletion duplication testing of the following 47 genes: APC, ATM, AXIN2, BARD1, BMPR1A, BRCA1, BRCA2, BRIP1, CDH1, CDK4, CDKN2A (p14ARF), CDKN2A (p16INK4a), CHEK2, CTNNA1, DICER1, EPCAM (Deletion/duplication testing only), GREM1 (promoter region deletion/duplication testing only), KIT, MEN1, MLH1, MSH2, MSH3, MSH6, MUTYH, NBN, NF1, NHTL1, PALB2, PDGFRA, PMS2, POLD1, POLE, PTEN, RAD50, RAD51C, RAD51D, RET, SDHB, SDHC, SDHD, SMAD4, SMARCA4. STK11, TP53, TSC1, TSC2, and VHL.  The following genes were evaluated for sequence changes only: SDHA and HOXB13 c.251G>A variant only. The report date is February 22, 2018.   Small cell carcinoma of lung ([place])  07/11/2019 Initial Diagnosis   Small cell carcinoma of lung ([place])   07/15/2019 -  Chemotherapy   The patient had palonosetron (ALOXI) injection 0.25 mg, 0.25 mg, Intravenous,  Once, 1 of 4 cycles Administration: 0.25 mg (07/15/2019) pegfilgrastim (NEULASTA ONPRO KIT) injection 6 mg, 6 mg, Subcutaneous, Once, 1 of 4 cycles CARBOplatin (PARAPLATIN) 350 mg in sodium chloride 0.9 % 250 mL chemo infusion, 350 mg (100 % of original dose 349.5 mg), Intravenous,  Once, 1 of 4 cycles Dose modification:   (original dose 349.5 mg, Cycle 1) Administration: 350 mg (07/15/2019) etoposide (VEPESID) 100 mg in sodium chloride 0.9 % 500 mL chemo infusion, 50 mg/m2 = 100 mg (100 % of original dose 50 mg/m2), Intravenous,  Once, 1 of 4 cycles Dose modification: 50 mg/m2 (original dose 50 mg/m2, Cycle 1, Reason: Change in LFTs), 50 mg/m2 (50 % of original dose 100 mg/m2, Cycle 1, Reason: Change in LFTs) Administration: 100 mg (07/15/2019), 100 mg (07/16/2019) fosaprepitant (EMEND) 150 mg in sodium chloride 0.9 % 145 mL IVPB, 150 mg, Intravenous,  Once, 1 of 4 cycles Administration: 150 mg (07/15/2019) atezolizumab (TECENTRIQ) 1,200 mg in sodium chloride 0.9 % 250 mL chemo infusion, 1,200 mg, Intravenous, Once, 1 of 8 cycles Administration: 1,200 mg  (07/15/2019)  for chemotherapy treatment.      CANCER STAGING: Cancer Staging No matching staging information was found for the patient.   INTERVAL HISTORY:  Douglas Baxter 79 y.o. male seen for follow-up of extensive stage metastatic small cell lung cancer.  He received first cycle of chemotherapy last week.  He is complaining of pain in the shoulder region and back which is not well controlled on the current regimen.  He is also not eating well.  Appetite and energy levels are 25%.  Denies any nausea vomiting or diarrhea.  Leg swellings have been stable.    REVIEW OF SYSTEMS:  Review of Systems  HENT:   Positive for trouble swallowing.   Cardiovascular: Positive for leg swelling.  Psychiatric/Behavioral: Positive for sleep disturbance.  All other systems reviewed and are negative.    PAST MEDICAL/SURGICAL HISTORY:  Past Medical History:  Diagnosis Date  . Arthritis   . Cancer [facility])    prostate  . Family history of lung cancer   . Family history of ovarian cancer   . Hematuria   . Nocturia    Past Surgical History:  Procedure Laterality Date  . [place]   lower back  . CHOLECYSTECTOMY  2000  . LYMPHADENECTOMY Bilateral 06/24/2013   Procedure: LYMPHADENECTOMY;  Surgeon: [name], MD;  Location: [facility];  Service: Urology;  Laterality: Bilateral;  . MENISCUS REPAIR Right jan 2014  . PROSTATE BIOPSY  Apr 26, 2013  . ROBOT ASSISTED LAPAROSCOPIC RADICAL PROSTATECTOMY N/A 06/24/2013   Procedure: ROBOTIC ASSISTED LAPAROSCOPIC  RADICAL PROSTATECTOMY LEVEL 2;  Surgeon: [name], MD;  Location: [facility];  Service: Urology;  Laterality: N/A;     SOCIAL HISTORY:  Social History   Socioeconomic History  . Marital status: Married    Spouse name: Not on file  . Number of children: Not on file  . Years of education: Not on file  . Highest education level: Not on file  Occupational History  . Not on file  Tobacco Use  . Smoking status: Former Smoker    Packs/day:  1.00    Types: Cigarettes    Quit date: 03/07/1978    Years since quitting: 41.4  . Smokeless tobacco: Never Used  Substance and Sexual Activity  . Alcohol use: Yes    Comment:  occasional  . Drug use: No  . Sexual activity: Not on file  Other Topics Concern  . Not on file  Social History Narrative  . Not on file   Social Determinants of Health   Financial Resource Strain:   . Difficulty of Paying Living Expenses:   Food Insecurity:   . Worried About Charity fundraiser in the Last Year:   . Arboriculturist in the Last Year:   Transportation Needs:   . Film/video editor (Medical):   Marland Kitchen Lack of Transportation (Non-Medical):   Physical Activity:   . Days of Exercise per Week:   . Minutes of Exercise per Session:   Stress:   . Feeling of Stress :   Social Connections:   . Frequency of Communication with Friends and Family:   . Frequency of Social Gatherings with Friends and Family:   . Attends Religious Services:   . Active Member of Clubs or Organizations:   . Attends Archivist Meetings:   Marland Kitchen Marital Status:   Intimate Partner Violence:   . Fear of Current or Ex-Partner:   . Emotionally Abused:   Marland Kitchen Physically Abused:   . Sexually Abused:     FAMILY HISTORY:  Family History  Problem Relation Age of Onset  . Deep vein thrombosis Mother   . Heart failure Father   . Ovarian cancer Sister 34  . Lung cancer Brother 26  . Cancer Maternal Aunt        NOS    CURRENT MEDICATIONS:  Outpatient Encounter Medications as of 07/22/2019  Medication Sig Note  . allopurinol (ZYLOPRIM) 300 MG tablet Take 1 tablet (300 mg total) by mouth daily.   Marland Kitchen apixaban (ELIQUIS) 5 MG TABS tablet Take 5 mg by mouth 2 (two) times daily.   [name] (TECENTRIQ IV) Inject 1,200 mg into the vein every 21 ( twenty-one) days.   . CARBOPLATIN IV Inject into the vein every 21 ( twenty-one) days.   . ETOPOSIDE IV Inject 100 mg/m2 into the vein every 21 ( twenty-one) days.   .  furosemide (LASIX) 20 MG tablet Take 20 mg by mouth daily.    Marland Kitchen gabapentin (NEURONTIN) 300 MG capsule Take 1 capsule (300 mg total) by mouth 2 (two) times daily. 07/05/2019: Has not started yet  . Lactulose 20 GM/30ML SOLN Take 30 mLs (20 g total) by mouth at bedtime. 07/05/2019: Has not started yet  . metoprolol tartrate (LOPRESSOR) 25 MG tablet Take 25 mg by mouth daily.    . Misc. Devices MISC Please provide patient with a walker.  He is 72" tall.   . Oxycodone HCl 10 MG TABS Take 1 tablet (10 mg total) by mouth every 4 (  four) hours as needed.   Vladimir Faster Glycol-Propyl Glycol (SYSTANE) 0.4-0.3 % GEL ophthalmic gel Place 1 application into both eyes in the morning and at bedtime.   . [DISCONTINUED] fentaNYL (DURAGESIC) 25 MCG/HR Place 1 patch onto the skin every 3 (three) days.   . magic mouthwash SOLN Take 5 mLs by mouth 4 (four) times daily. (Patient not taking: Reported on 07/22/2019)   . prochlorperazine (COMPAZINE) 10 MG tablet Take 1 tablet (10 mg total) by mouth every 6 (six) hours as needed (Nausea or vomiting). (Patient not taking: Reported on 07/22/2019)   . zolpidem (AMBIEN) 10 MG tablet TAKE 1 TABLET BY MOUTH EVERY DAY AT BEDTIME AS NEEDED FOR SLEEP (Patient not taking: Reported on 07/22/2019)   . [DISCONTINUED] fentaNYL (DURAGESIC) 50 MCG/HR Place 1 patch onto the skin every 3 (three) days.   . [DISCONTINUED] HYDROcodone-acetaminophen (NORCO/VICODIN) 5-325 MG tablet Take 1 tablet by mouth every 4 (four) hours as needed for moderate pain. (Patient not taking: Reported on 07/17/2019)    No facility-administered encounter medications on file as of 07/22/2019.    ALLERGIES:  Allergies  Allergen Reactions  . Diltiazem   . Sulfa Antibiotics Nausea And Vomiting     PHYSICAL EXAM:  ECOG Performance status: 2  Vitals:   07/22/19 0812  BP: 99/60  Pulse: 62  Resp: 18  Temp: (!) 96.8 F (36 C)  SpO2: 97%   Filed Weights   07/22/19 0812  Weight: 173 lb 11.2 oz (78.8 kg)    Physical Exam Vitals reviewed.  Constitutional:      Appearance: Normal appearance.  HENT:     Head: Normocephalic and atraumatic.  Cardiovascular:     Rate and Rhythm: Normal rate and regular rhythm.     Heart sounds: Normal heart sounds.  Pulmonary:     Effort: Pulmonary effort is normal.     Breath sounds: Normal breath sounds.  Lymphadenopathy:     Cervical: Cervical adenopathy present.  Skin:    General: Skin is warm.  Neurological:     General: No focal deficit present.     Mental Status: He is alert and oriented to person, place, and time.  Psychiatric:        Mood and Affect: Mood normal.        Behavior: Behavior normal.      LABORATORY DATA:  I have reviewed the labs as listed.  CBC    Component Value Date/Time   WBC 14.5 (H) 07/22/2019 0827   RBC 4.53 07/22/2019 0827   HGB 13.6 07/22/2019 0827   HCT 41.4 07/22/2019 0827   PLT 84 (L) 07/22/2019 0827   MCV 91.4 07/22/2019 0827   MCH 30.0 07/22/2019 0827   MCHC 32.9 07/22/2019 0827   RDW 14.4 07/22/2019 0827   LYMPHSABS 1.3 07/22/2019 0827   MONOABS 0.2 07/22/2019 0827   EOSABS 0.1 07/22/2019 0827   BASOSABS 0.1 07/22/2019 0827   CMP Latest Ref Rng & Units 07/22/2019 07/19/2019 07/17/2019  Glucose 70 - 99 mg/dL 102(H) 108(H) 153(H)  BUN 8 - 23 mg/dL 33(H) 55(H) 65(H)  Creatinine 0.61 - 1.24 mg/dL 0.93 1.28(H) 1.70(H)  Sodium 135 - 145 mmol/L 137 138 137  Potassium 3.5 - 5.1 mmol/L 3.8 4.3 4.7  Chloride 98 - 111 mmol/L 103 103 106  CO2 22 - 32 mmol/L 24 23 21(L)  Calcium 8.9 - 10.3 mg/dL 8.5(L) 10.2 10.6(H)  Total Protein 6.5 - 8.1 g/dL 6.1(L) 6.4(L) 6.4(L)  Total Bilirubin 0.3 - 1.2 mg/dL 1.8(H)  1.6(H) 1.3(H)  Alkaline Phos 38 - 126 U/L 332(H) 320(H) 330(H)  AST 15 - 41 U/L 78(H) 135(H) 120(H)  ALT 0 - 44 U/L 74(H) 96(H) 81(H)    DIAGNOSTIC IMAGING:  I have reviewed scans.  ASSESSMENT & PLAN:  Small cell carcinoma of lung ([place]) 1.  Extensive stage small cell carcinoma metastatic to the liver  and bones: -PET scan on 07/02/2019 showed widespread metastatic disease involving liver and bones. -Cycle 1 of carboplatin, VP-16 and atezolizumab on 07/15/2019. -I have reviewed his labs.  Platelet count decreased to 84.  AST and ALT have improved but elevated.  Bilirubin is 1.8. -He will receive 1 L of fluids today.  2.  Back pain/left shoulder pain/right-sided rib pain: -I will increase fentanyl to 50 mcg patch today.  Continue oxycodone 10 mg every 4 hours as needed.  3.  Atrial fibrillation: -Continue Eliquis twice daily.  He has a pacemaker.  4.  Insomnia: -Continue Ambien at bedtime.  5.  Nutrition: -He has lost 1 pound.  He was encouraged to drink protein supplements.  6.  Brain meta stasis: -CT of the head with and without contrast showed 11 x 14 mm cystic mass in the left posterior frontal cortex over the convexity.  Does not have any symptoms from this.  Lytic lesion in the left parietal bone was present in August 2019 which may have very slightly increased.  I plan to rescan his brain after 2 cycles.      Orders placed this encounter:  No orders of the defined types were placed in this encounter.    Derek Jack, MD [place] [phone]

## 2019-07-22 NOTE — Patient Instructions (Addendum)
[place] at [facility] Discharge Instructions  You were seen today by Dr. Delton Coombes. He went over your recent lab results. He will increase your Fentanyl patch to 50 mcg. He will see you back in 2 weeks for labs, treatment and follow up.   Thank you for choosing [place] at [facility] to provide your oncology and hematology care.  To afford each patient quality time with our provider, please arrive at least 15 minutes before your scheduled appointment time.   If you have a lab appointment with the [facility] please come in thru the  Main Entrance and check in at the main information desk  You need to re-schedule your appointment should you arrive 10 or more minutes late.  We strive to give you quality time with our providers, and arriving late affects you and other patients whose appointments are after yours.  Also, if you no show three or more times for appointments you may be dismissed from the clinic at the providers discretion.     Again, thank you for choosing [facility].  Our hope is that these requests will decrease the amount of time that you wait before being seen by our physicians.       _____________________________________________________________  Should you have questions after your visit to [facility], please contact our office at [phone] between the hours of 8:00 a.m. and 4:30 p.m.  Voicemails left after 4:00 p.m. will not be returned until the following business day.  For prescription refill requests, have your pharmacy contact our office and allow 72 hours.    Cancer Center Support Programs:   > Cancer Support Group  2nd Tuesday of the month 1pm-2pm, Journey Room

## 2019-07-22 NOTE — Progress Notes (Signed)
Labs reviewed today, will given one liter hydration fluids per orders.    Patient tolerated it well without problems. Vitals stable and discharged home from clinic via wheelchair. Follow up as scheduled.

## 2019-07-22 NOTE — Assessment & Plan Note (Signed)
1.  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 -Cycle 1 of carboplatin, VP-16 and atezolizumab on 07/15/2019. -I have reviewed his labs.  Platelet count decreased to 84.  AST and ALT have improved but elevated.  Bilirubin is 1.8. -He will receive 1 L of fluids today.  2.  Back pain/left shoulder pain/right-sided rib pain: -I will increase fentanyl to 50 mcg patch today.  Continue oxycodone 10 mg every 4 hours as needed.  3.  Atrial fibrillation: -Continue Eliquis twice daily.  He has a pacemaker.  4.  Insomnia: -Continue Ambien at bedtime.  5.  Nutrition: -He has lost 1 pound.  He was encouraged to drink protein supplements.  6.  Brain meta stasis: -CT of the head with and without contrast showed 11 x 14 mm cystic mass in the left posterior frontal cortex over the convexity.  Does not have any symptoms from this.  Lytic lesion in the left parietal bone was present in August 2019 which may have very slightly increased.  I plan to rescan his brain after 2 cycles.

## 2019-07-23 LAB — T4: T4, Total: 6.5 ug/dL (ref 4.5–12.0)

## 2019-08-06 ENCOUNTER — Ambulatory Visit (HOSPITAL_COMMUNITY): Payer: Medicare HMO | Admitting: Hematology

## 2019-08-06 ENCOUNTER — Other Ambulatory Visit (HOSPITAL_COMMUNITY): Payer: Medicare HMO

## 2019-08-06 ENCOUNTER — Ambulatory Visit (HOSPITAL_COMMUNITY): Payer: Medicare HMO

## 2019-08-06 DEATH — deceased

## 2019-08-07 ENCOUNTER — Ambulatory Visit (HOSPITAL_COMMUNITY): Payer: Medicare HMO

## 2019-08-08 ENCOUNTER — Ambulatory Visit (HOSPITAL_COMMUNITY): Payer: Medicare HMO

## 2019-08-16 ENCOUNTER — Other Ambulatory Visit (HOSPITAL_COMMUNITY): Payer: Medicare HMO

## 2019-08-16 ENCOUNTER — Ambulatory Visit (HOSPITAL_COMMUNITY): Payer: Medicare HMO

## 2019-08-16 ENCOUNTER — Encounter (HOSPITAL_COMMUNITY): Payer: Medicare HMO

## 2019-09-07 IMAGING — CT CT CHEST WITH CONTRAST
2 of 3 series · 14 of 36 positions shown, 17 images · IV contrast (omnipaque)
Comparison: Chest CT 01/16/2018.

CLINICAL DATA: 77-year-old male with history of neuroendocrine
cancer status post 4 months of chemotherapy. Evaluate response to
treatment.

EXAM:
CT CHEST WITH CONTRAST
TECHNIQUE: Multidetector CT imaging of the chest was performed during
intravenous contrast administration.
CONTRAST:  60mL OMNIPAQUE IOHEXOL 300 MG/ML  SOLN

[Series 2: axial st · axial · 0.70mm/px · z∈[+1289,+1587]mm · 11 of 175 slices shown, 14 images]
[im 13/175  mediastinal]
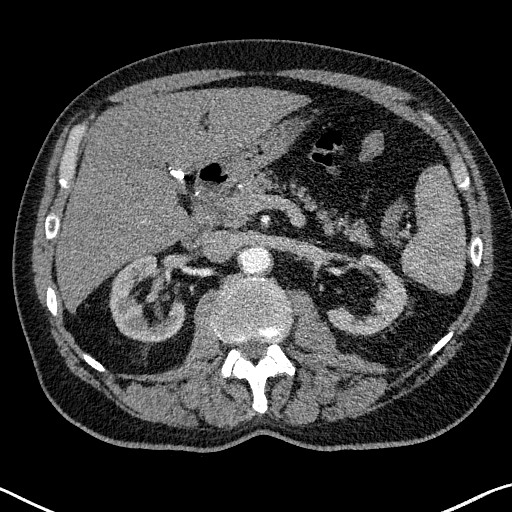
[im 13/175  lung]
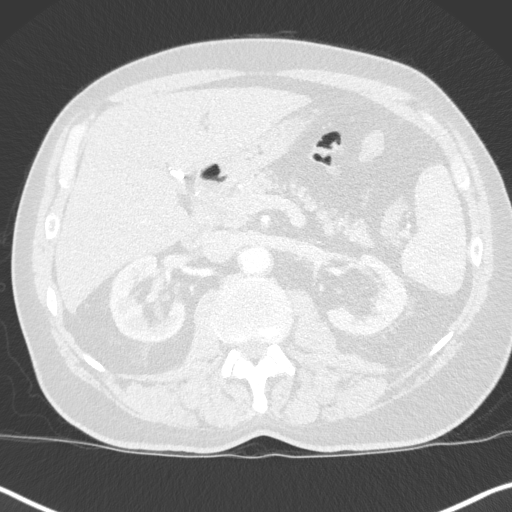
[im 26/175  lung]
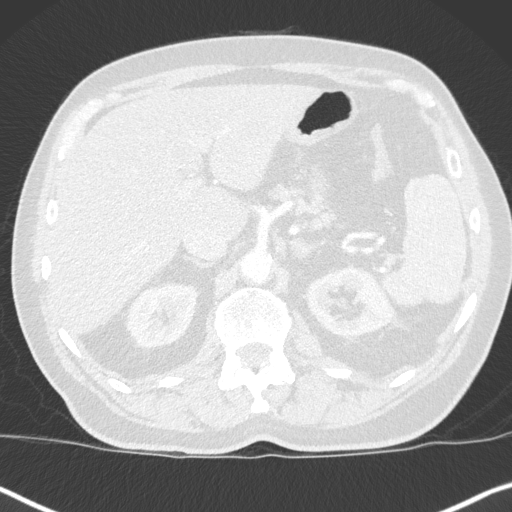
[im 39/175  lung]
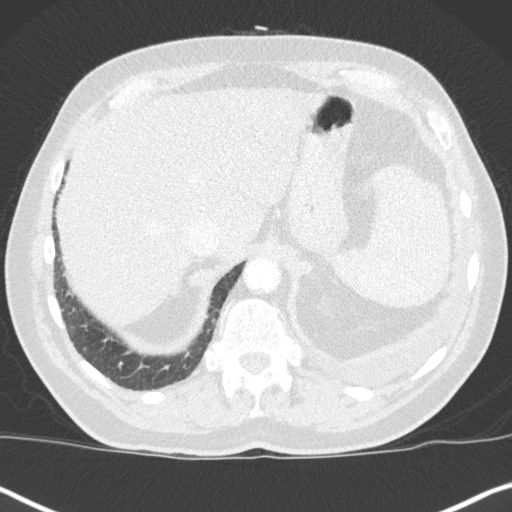
[im 59/175  lung]
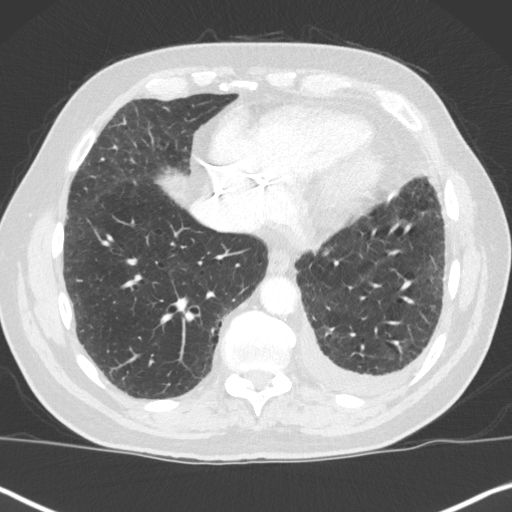
[im 71/175  mediastinal]
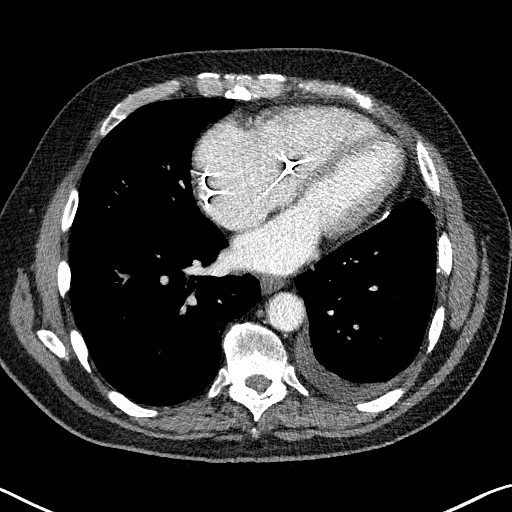
[im 71/175  lung]
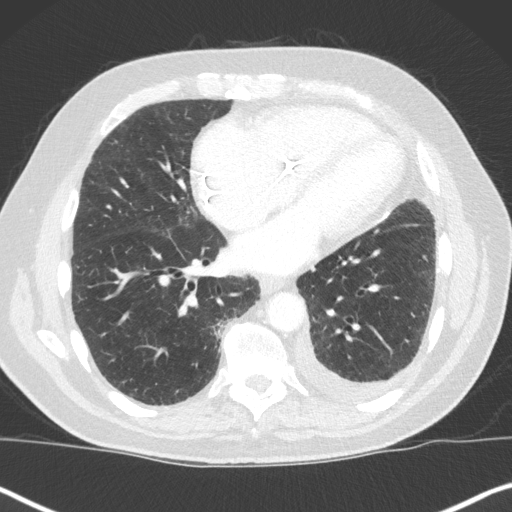
[im 91/175  lung]
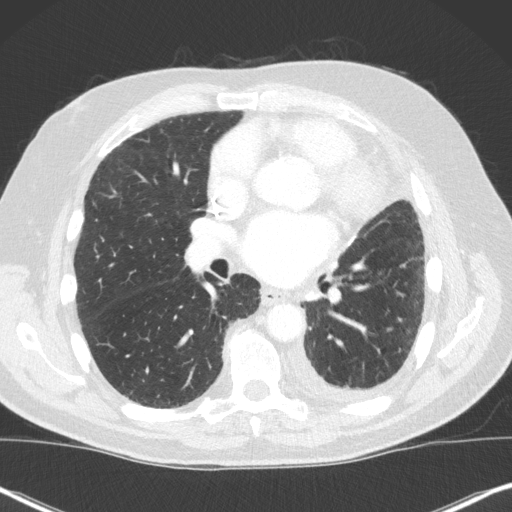
[im 104/175  lung]
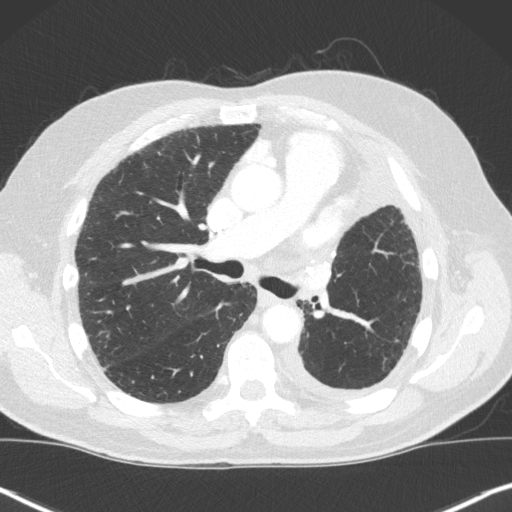
[im 117/175  lung]
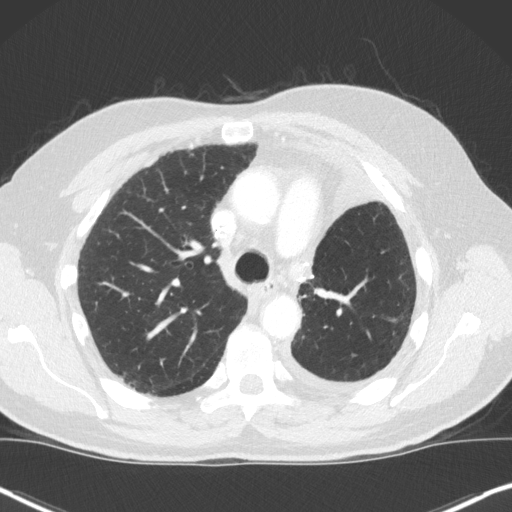
[im 136/175  mediastinal]
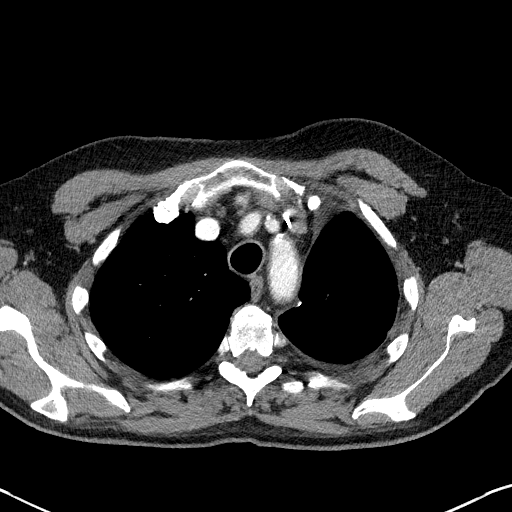
[im 136/175  lung]
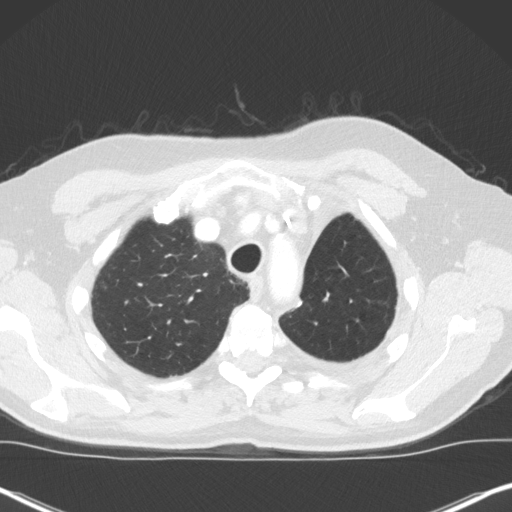
[im 149/175  lung]
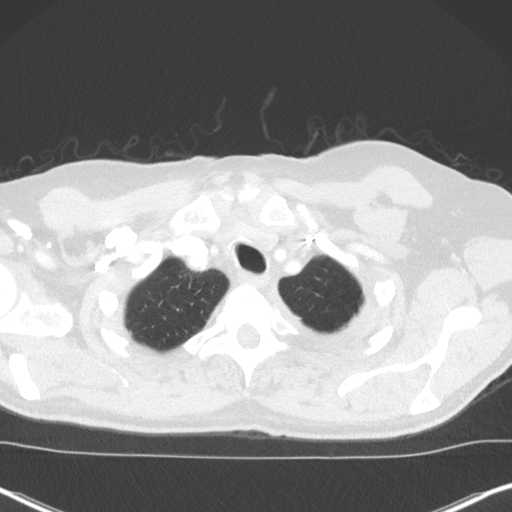
[im 162/175  lung]
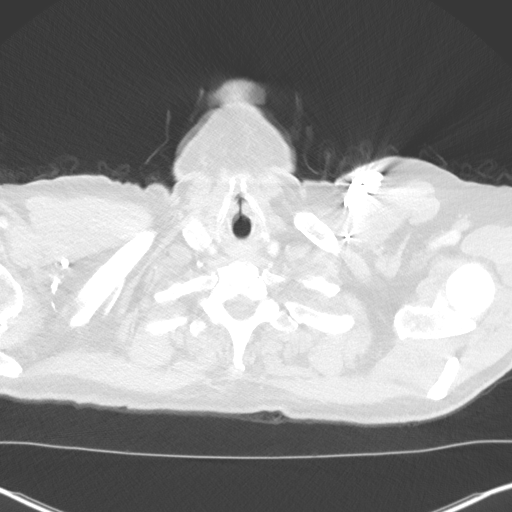

[Series 5: coronal · coronal · 0.69mm/px · 3 of 148 slices shown]
[im 30/148  lung]
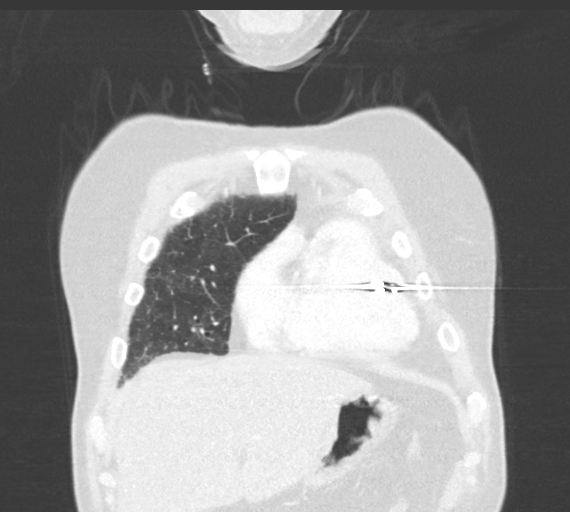
[im 59/148  lung]
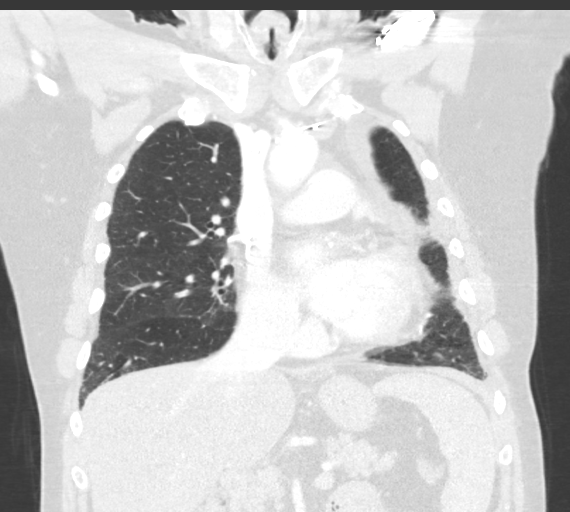
[im 89/148  lung]
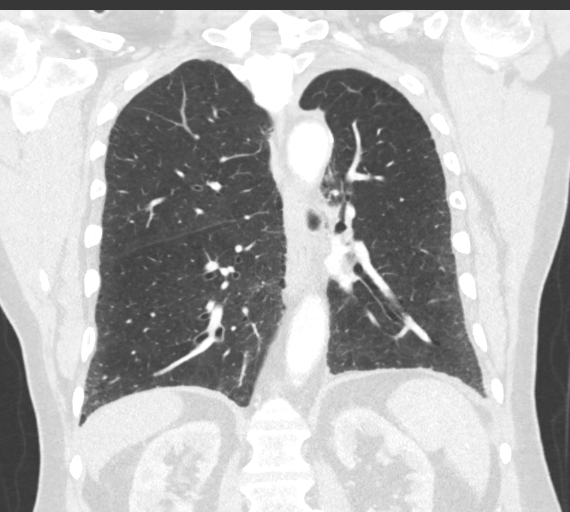

[14 of 36 positions shown; findings below may reference images not displayed]

FINDINGS: Cardiovascular: Heart size is mildly enlarged with biatrial
dilatation. There is no significant pericardial fluid, thickening or
pericardial calcification. There is aortic atherosclerosis, as well
as atherosclerosis of the great vessels of the mediastinum and the
coronary arteries, including calcified atherosclerotic plaque in the
left main, left circumflex and right coronary arteries. Left-sided
pacemaker device in place with lead tip terminating in the right
atrium. Thickening calcification of the aortic valve.

Mediastinum/Nodes: No pathologically enlarged mediastinal or hilar
lymph nodes. Esophagus is unremarkable in appearance. No axillary
lymphadenopathy.

Lungs/Pleura: Status post left upper lobectomy. Small chronic left
pleural effusion, similar to slightly decreased compared to the
prior study. No suspicious appearing pulmonary nodules or masses are
noted. No acute consolidative airspace disease. Mild diffuse
bronchial wall thickening with mild to moderate centrilobular and
paraseptal emphysema. No suspicious appearing pulmonary nodules or
masses are noted.

Upper Abdomen: Aortic atherosclerosis. Status post cholecystectomy.
Diffuse low attenuation throughout the visualized hepatic
parenchyma, indicative of hepatic steatosis. 1.6 cm left adrenal
nodule, stable compared to the prior study, incompletely
characterized. 1.5 cm exophytic low-attenuation lesion in the upper
pole of the right kidney and 1.4 cm low-attenuation lesion in the
upper pole of the left kidney, compatible with simple cysts. Smaller
11 mm simple cyst in the interpolar region of the left kidney.

Musculoskeletal: There are no aggressive appearing lytic or blastic
lesions noted in the visualized portions of the skeleton.
IMPRESSION: 1. Status post left upper lobectomy. No findings to suggest
metastatic disease in the thorax.
2. Stable to slight decreased size of small chronic left pleural
effusion.
3. Diffuse bronchial wall thickening with mild to moderate
centrilobular and paraseptal emphysema; imaging findings suggestive
of underlying COPD.
4. Aortic atherosclerosis, in addition to left main and 2 vessel
coronary artery disease. Assessment for potential risk factor
modification, dietary therapy or pharmacologic therapy may be
warranted, if clinically indicated.
5. Cardiomegaly with biatrial dilatation.
6. Indeterminate left adrenal nodule, stable compared to recent
prior CT examination. Attention at time of follow-up studies is
recommended to ensure continued stability.

Aortic Atherosclerosis (80V6N-NV2.2) and Emphysema (80V6N-U76.Q).

## 2019-10-02 ENCOUNTER — Ambulatory Visit (HOSPITAL_COMMUNITY): Payer: Medicare HMO

## 2019-10-02 ENCOUNTER — Other Ambulatory Visit (HOSPITAL_COMMUNITY): Payer: Medicare Other

## 2019-10-09 ENCOUNTER — Ambulatory Visit (HOSPITAL_COMMUNITY): Payer: Medicare Other | Admitting: Hematology
